# Patient Record
Sex: Male | Born: 1985 | Race: White | Hispanic: No | Marital: Married | Smoking: Never smoker
Health system: Southern US, Community
[De-identification: ages and names within clinical notes are randomized; demographics above are authoritative.]

## PROBLEM LIST (undated history)

## (undated) DIAGNOSIS — F419 Anxiety disorder, unspecified: Secondary | ICD-10-CM

## (undated) DIAGNOSIS — Z8619 Personal history of other infectious and parasitic diseases: Secondary | ICD-10-CM

## (undated) DIAGNOSIS — I10 Essential (primary) hypertension: Secondary | ICD-10-CM

## (undated) DIAGNOSIS — N289 Disorder of kidney and ureter, unspecified: Secondary | ICD-10-CM

## (undated) DIAGNOSIS — Z87442 Personal history of urinary calculi: Secondary | ICD-10-CM

## (undated) DIAGNOSIS — N189 Chronic kidney disease, unspecified: Secondary | ICD-10-CM

## (undated) HISTORY — DX: Personal history of other infectious and parasitic diseases: Z86.19

## (undated) HISTORY — DX: Anxiety disorder, unspecified: F41.9

## (undated) HISTORY — DX: Chronic kidney disease, unspecified: N18.9

## (undated) HISTORY — DX: Personal history of urinary calculi: Z87.442

---

## 2004-07-07 DIAGNOSIS — Z87442 Personal history of urinary calculi: Secondary | ICD-10-CM

## 2004-07-07 HISTORY — DX: Personal history of urinary calculi: Z87.442

## 2005-02-14 ENCOUNTER — Emergency Department (HOSPITAL_COMMUNITY): Admission: EM | Admit: 2005-02-14 | Discharge: 2005-02-15 | Payer: Self-pay | Admitting: Emergency Medicine

## 2005-09-06 IMAGING — CT CT PELVIS W/O CM
1 of 2 series · 15 of 32 positions shown, 19 images · non-contrast
Comparison: None.

CLINICAL DATA: Rule out stones.  Left flank pain.  
 ABDOMEN CT WITHOUT CONTRAST (RENAL STONE PROTOCOL):
TECHNIQUE: Multidetector CT imaging of the abdomen was performed following the standard protocol without IV contrast.
TECHNIQUE: Multidetector CT imaging of the pelvis was performed following the standard protocol without IV contrast.

[Series 2: renal stone · axial · 0.70mm/px · z∈[-441,-71]mm · 15 of 82 slices shown, 19 images]
[im 4/82  soft-tissue]
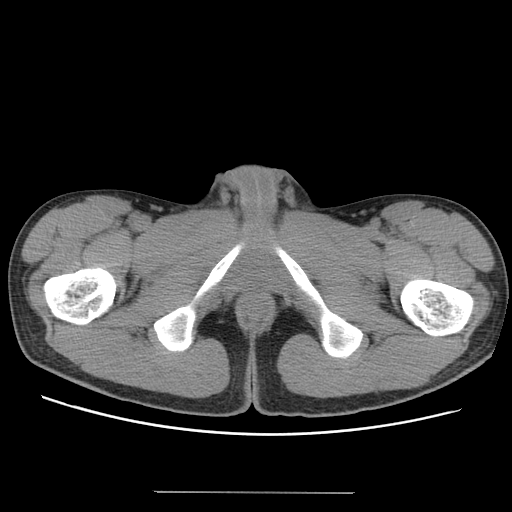
[im 4/82  bone]
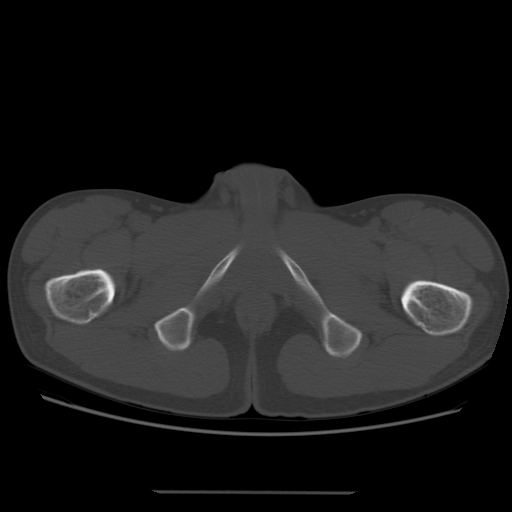
[im 11/82  soft-tissue]
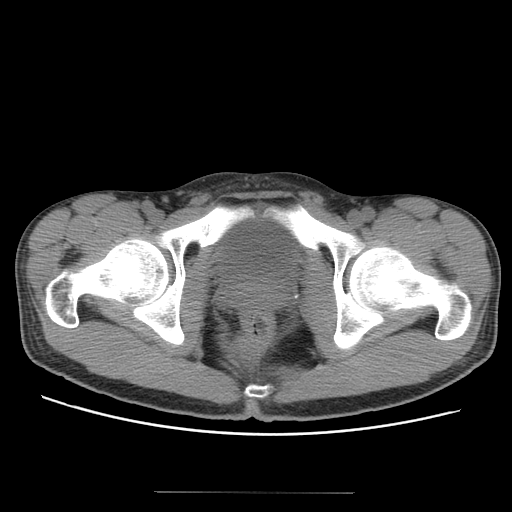
[im 17/82  soft-tissue]
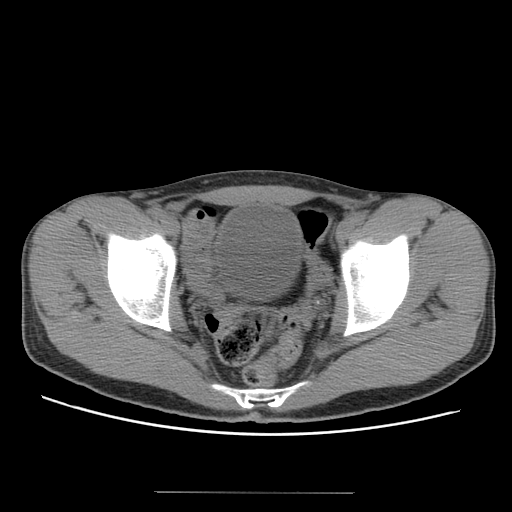
[im 24/82  soft-tissue]
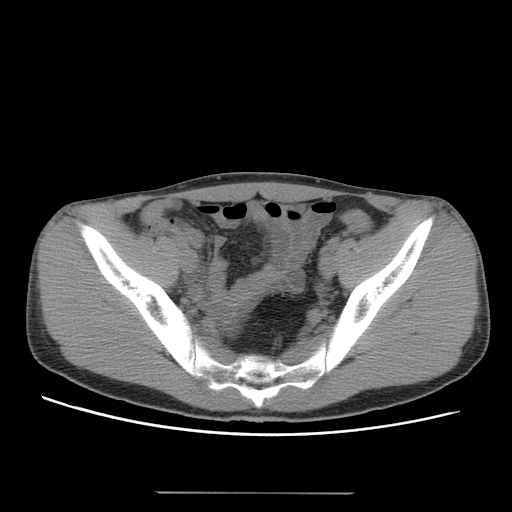
[im 28/82  soft-tissue]
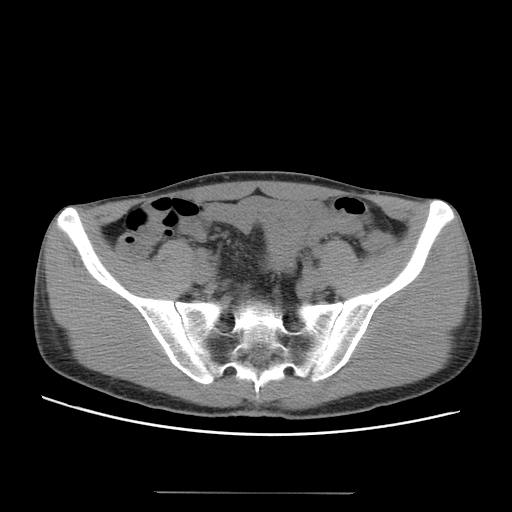
[im 34/82  soft-tissue]
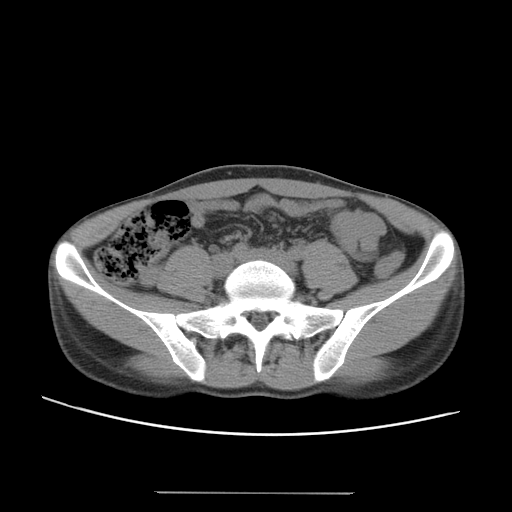
[im 41/82  soft-tissue]
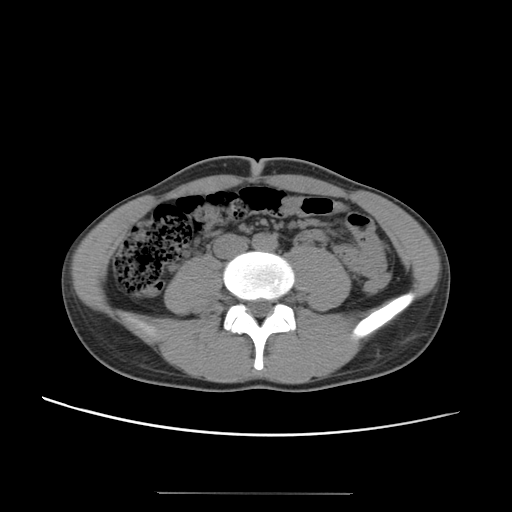
[im 48/82  soft-tissue]
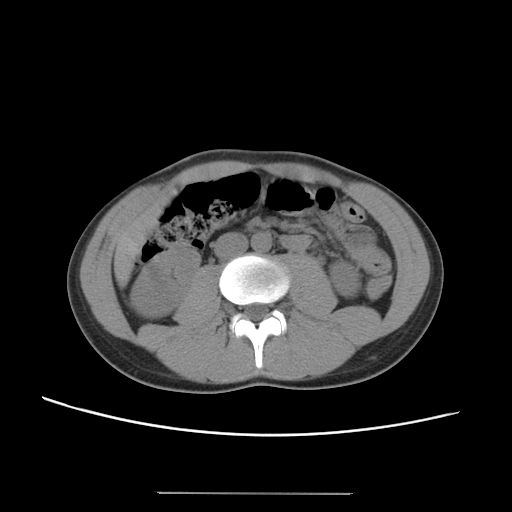
[im 55/82  soft-tissue]
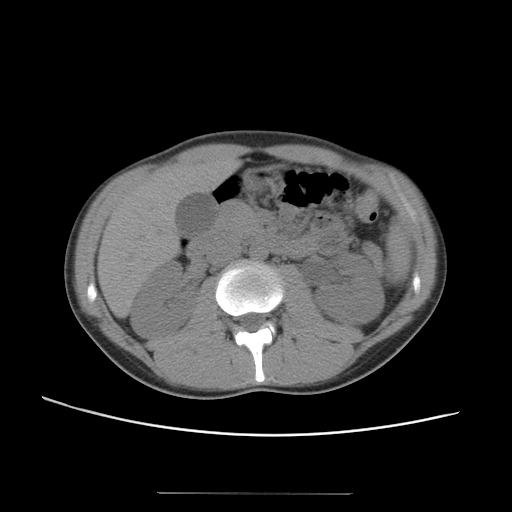
[im 55/82  bone]
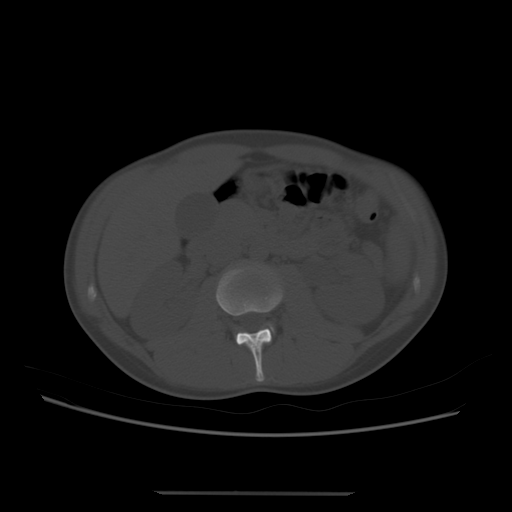
[im 58/82  soft-tissue]
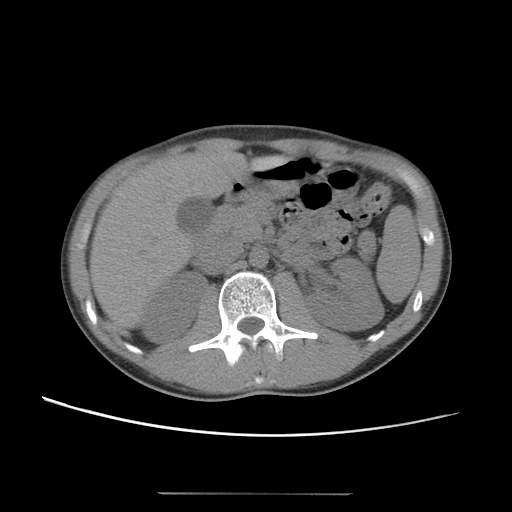
[im 65/82  soft-tissue]
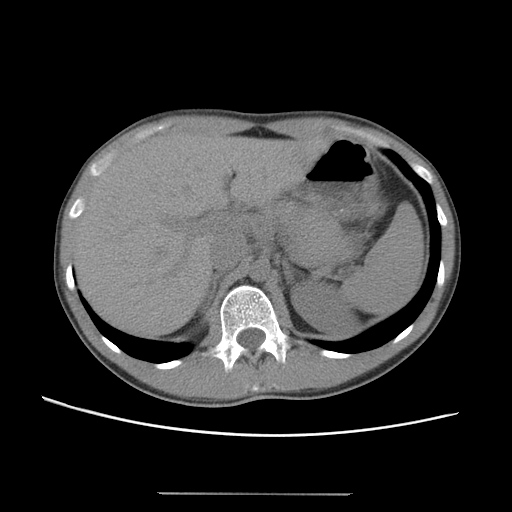
[im 68/82  lung]
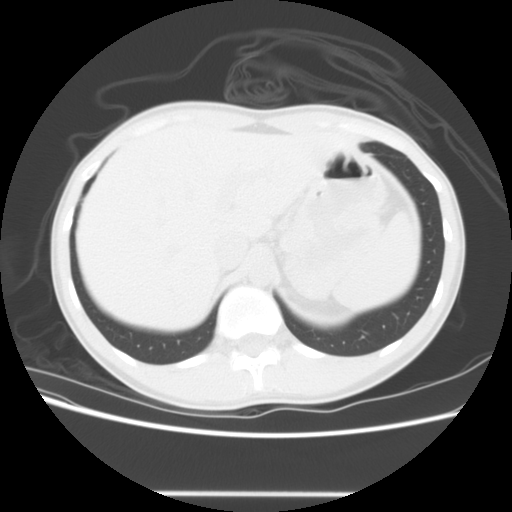
[im 71/82  soft-tissue]
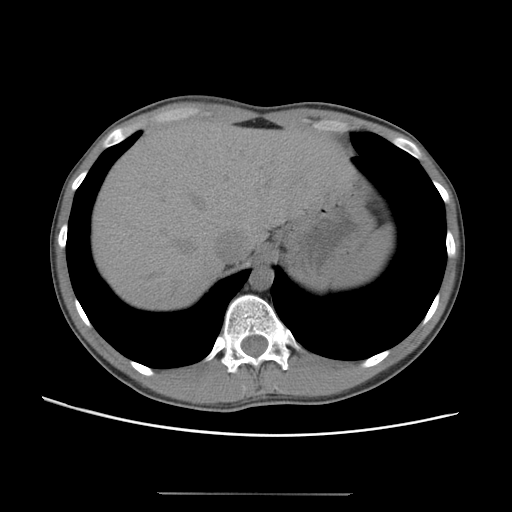
[im 71/82  lung]
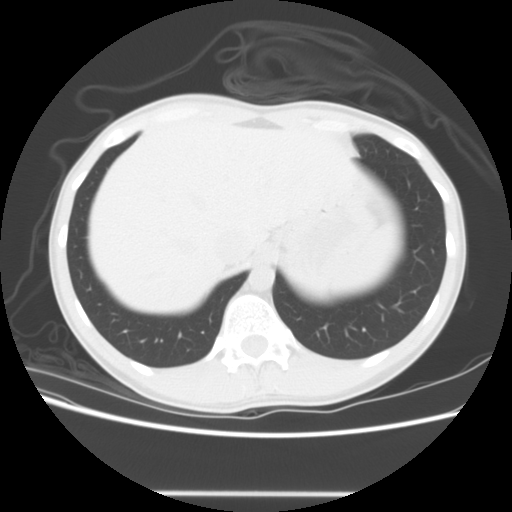
[im 75/82  lung]
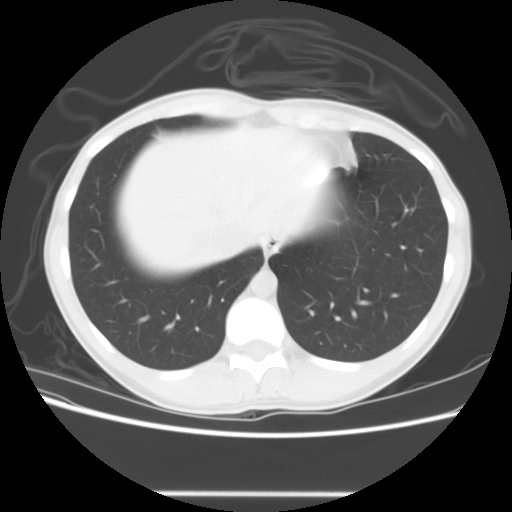
[im 78/82  soft-tissue]
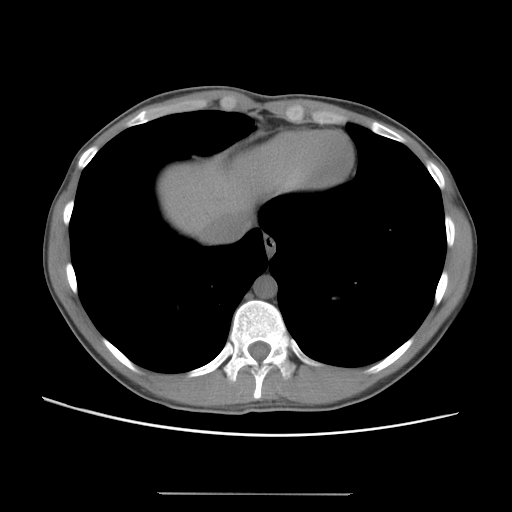
[im 78/82  lung]
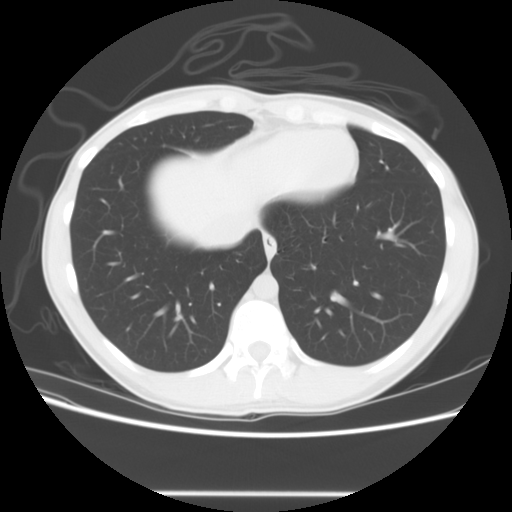

[15 of 32 positions shown; findings below may reference images not displayed]

FINDINGS: Visualized lung bases are clear. 
 The liver is normal in attenuation and morphology.  
 The spleen is negative. 
 Adrenal glands are negative. 
 Pancreas is negative.  
 Punctate calcification within the right renal collecting system is noted.  The right kidney is otherwise unremarkable without evidence for hydronephrosis.  On the left there is mild right-sided hydronephrosis.  A 2.5 mm nonobstructive stone is identified within the left distal ureter just proximal to the UPJ.
 Negative for lymphadenopathy.  
 Visualized bowel loops are unremarkable.  Appendix is negative.  No free abdominal fluid.
IMPRESSION: Mild left-sided hydronephrosis with a 2.5 mm distal left ureteral stone.  
 PELVIS CT WITHOUT CONTRAST (RENAL STONE PROTOCOL):
FINDINGS: No free pelvic fluid.  Pelvic bowel loops negative.  No pelvic lymphadenopathy.  Within the distal left ureter there is a tiny 2.5 mm distal left ureteral nonobstructive ureteral stone.
IMPRESSION: Nonobstructive distal left ureteral stone.

## 2011-12-22 ENCOUNTER — Encounter: Payer: Self-pay | Admitting: Internal Medicine

## 2011-12-22 ENCOUNTER — Ambulatory Visit (INDEPENDENT_AMBULATORY_CARE_PROVIDER_SITE_OTHER): Payer: BC Managed Care – PPO | Admitting: Internal Medicine

## 2011-12-22 VITALS — BP 112/82 | HR 70 | Temp 98.5°F | Resp 16 | Ht 68.0 in | Wt 152.0 lb

## 2011-12-22 DIAGNOSIS — Z0000 Encounter for general adult medical examination without abnormal findings: Secondary | ICD-10-CM

## 2011-12-22 HISTORY — PX: NO PAST SURGERIES: SHX2092

## 2011-12-22 LAB — LIPID PANEL
Total CHOL/HDL Ratio: 3.9 Ratio
Triglycerides: 91 mg/dL (ref <150)
VLDL: 18 mg/dL (ref 0–40)

## 2011-12-22 LAB — HEPATIC FUNCTION PANEL
ALT: 11 U/L (ref 0–53)
AST: 18 U/L (ref 0–37)
Albumin: 5 g/dL (ref 3.5–5.2)
Alkaline Phosphatase: 72 U/L (ref 39–117)
Bilirubin, Direct: 0.2 mg/dL (ref 0.0–0.3)
Indirect Bilirubin: 0.7 mg/dL (ref 0.0–0.9)
Total Bilirubin: 0.9 mg/dL (ref 0.3–1.2)
Total Protein: 7.6 g/dL (ref 6.0–8.3)

## 2011-12-22 LAB — CBC WITH DIFFERENTIAL/PLATELET
Basophils Absolute: 0 10*3/uL (ref 0.0–0.1)
Basophils Relative: 0 % (ref 0–1)
Eosinophils Absolute: 0.1 10*3/uL (ref 0.0–0.7)
Eosinophils Relative: 1 % (ref 0–5)
HCT: 44 % (ref 39.0–52.0)
Neutro Abs: 4.2 10*3/uL (ref 1.7–7.7)
RBC: 5.18 MIL/uL (ref 4.22–5.81)
WBC: 7.3 10*3/uL (ref 4.0–10.5)

## 2011-12-22 LAB — BASIC METABOLIC PANEL
BUN: 10 mg/dL (ref 6–23)
CO2: 26 mEq/L (ref 19–32)
Glucose, Bld: 83 mg/dL (ref 70–99)

## 2011-12-22 LAB — TSH: TSH: 1.259 u[IU]/mL (ref 0.350–4.500)

## 2011-12-22 NOTE — Progress Notes (Signed)
  Subjective:    Patient ID: Alejandro Weber, male    DOB: [birth year], 26 y.o.   MRN: [id number]  HPI Pt presents to clinic for physical exam. Is in good health without active complaint. Has h/o kidney stones with 2.5cm left ureteral stone noted on CT in 2006 however has had no further difficulty since that time.   Past Medical History  Diagnosis Date  . History of chicken pox     childhood  . History of kidney stones 2006   Past Surgical History  Procedure Date  . No past surgeries 12/22/2011    reports that he has never smoked. He has never used smokeless tobacco. He reports that he does not drink alcohol or use illicit drugs. family history includes Colon cancer in his paternal grandfather; Coronary artery disease in his maternal grandfather; Diabetes in his paternal grandmother; Heart disease in his father; Hyperlipidemia in his mother; Hypertension in his father; and Prostate cancer in his maternal grandfather. Allergies  Allergen Reactions  . Amoxicillin Hives     Review of Systems  Respiratory: Negative for cough.   Cardiovascular: Negative for chest pain.  Gastrointestinal: Negative for abdominal pain.       Objective:   Physical Exam  Physical Exam  Nursing note and vitals reviewed. Constitutional: He appears well-developed and well-nourished. No distress.  HENT:  Head: Normocephalic and atraumatic.  Right Ear: Tympanic membrane and external ear normal.  Left Ear: Tympanic membrane and external ear normal.  Nose: Nose normal.  Mouth/Throat: Uvula is midline, oropharynx is clear and moist and mucous membranes are normal. No oropharyngeal exudate.  Eyes: Conjunctivae and EOM are normal. Pupils are equal, round, and reactive to light. Right eye exhibits no discharge. Left eye exhibits no discharge. No scleral icterus.  Neck: Neck supple. Carotid bruit is not present. No thyromegaly present.  Cardiovascular: Normal rate, regular rhythm and normal heart sounds.  Exam  reveals no gallop and no friction rub.   No murmur heard. Pulmonary/Chest: Effort normal and breath sounds normal. No respiratory distress. He has no wheezes. He has no rales.  Abdominal: Soft. He exhibits no distension and no mass. There is no hepatosplenomegaly. There is no tenderness. There is no rebound. Hernia confirmed negative in the right inguinal area and confirmed negative in the left inguinal area.  Genitourinary:Right testis shows no mass, no swelling and no tenderness. Right testis is descended. Left testis shows no mass, no swelling and no tenderness. Left testis is descended.  Lymphadenopathy:    He has no cervical adenopathy.       Right: No inguinal adenopathy present.       Left: No inguinal adenopathy present.  Neurological: He is alert.  Skin: Skin is warm and dry. He is not diaphoretic.  Psychiatric: He has a normal mood and affect.        Assessment & Plan:

## 2011-12-22 NOTE — Assessment & Plan Note (Signed)
Nl exam. Obtain cpe labs. 

## 2011-12-22 NOTE — Patient Instructions (Signed)
Please schedule fasting labs prior to next year's physical Cbc, chem7, lipid, lft, tsh, ua with reflex- v70.0 

## 2011-12-23 LAB — URINALYSIS, ROUTINE W REFLEX MICROSCOPIC
Bilirubin Urine: NEGATIVE
Glucose, UA: NEGATIVE mg/dL
Leukocytes, UA: NEGATIVE
Protein, ur: NEGATIVE mg/dL
Specific Gravity, Urine: 1.017 (ref 1.005–1.030)

## 2015-04-23 ENCOUNTER — Encounter: Payer: Self-pay | Admitting: Physician Assistant

## 2015-04-23 ENCOUNTER — Ambulatory Visit (INDEPENDENT_AMBULATORY_CARE_PROVIDER_SITE_OTHER): Payer: Managed Care, Other (non HMO) | Admitting: Physician Assistant

## 2015-04-23 VITALS — BP 126/78 | HR 73 | Temp 98.1°F | Ht 68.5 in | Wt 157.4 lb

## 2015-04-23 DIAGNOSIS — F411 Generalized anxiety disorder: Secondary | ICD-10-CM

## 2015-04-23 LAB — BASIC METABOLIC PANEL
BUN: 14 mg/dL (ref 6–23)
CALCIUM: 9.8 mg/dL (ref 8.4–10.5)
CO2: 25 meq/L (ref 19–32)
Chloride: 106 mEq/L (ref 96–112)
Creatinine, Ser: 1.01 mg/dL (ref 0.40–1.50)
GFR: 91.85 mL/min (ref >60.00)
GLUCOSE: 91 mg/dL (ref 70–99)
Potassium: 4.5 mEq/L (ref 3.5–5.1)
SODIUM: 140 meq/L (ref 135–145)

## 2015-04-23 LAB — TSH: TSH: 1.39 u[IU]/mL (ref 0.35–4.50)

## 2015-04-23 MED ORDER — ESCITALOPRAM OXALATE 10 MG PO TABS: 10.0000 mg | ORAL_TABLET | Freq: Every day | ORAL | Status: DC

## 2015-04-23 NOTE — Assessment & Plan Note (Signed)
Will check TSH and BMP today. Will begin Lexapro 10 mg daily. Follow-up 4 weeks. Will do CPE at that time.

## 2015-04-23 NOTE — Patient Instructions (Signed)
Please go to the lab for blood work. I will call you with your results. Start the Lexapro as directed for anxiety. Remember it will take a few weeks to start working so make sure to take daily.  Follow-up 4-6 weeks for follow-up and a complete physical. Return sooner if needed.  Escitalopram tablets What is this medicine? ESCITALOPRAM (es sye TAL oh pram) is used to treat depression and certain types of anxiety. This medicine may be used for other purposes; ask your health care provider or pharmacist if you have questions. What should I tell my health care provider before I take this medicine? They need to know if you have any of these conditions: -bipolar disorder or a family history of bipolar disorder -diabetes -glaucoma -heart disease -kidney or liver disease -receiving electroconvulsive therapy -seizures (convulsions) -suicidal thoughts, plans, or attempt by you or a family member -an unusual or allergic reaction to escitalopram, the related drug citalopram, other medicines, foods, dyes, or preservatives -pregnant or trying to become pregnant -breast-feeding How should I use this medicine? Take this medicine by mouth with a glass of water. Follow the directions on the prescription label. You can take it with or without food. If it upsets your stomach, take it with food. Take your medicine at regular intervals. Do not take it more often than directed. Do not stop taking this medicine suddenly except upon the advice of your doctor. Stopping this medicine too quickly may cause serious side effects or your condition may worsen. A special MedGuide will be given to you by the pharmacist with each prescription and refill. Be sure to read this information carefully each time. Talk to your pediatrician regarding the use of this medicine in children. Special care may be needed. Overdosage: If you think you have taken too much of this medicine contact a poison control center or emergency room at  once. NOTE: This medicine is only for you. Do not share this medicine with others. What if I miss a dose? If you miss a dose, take it as soon as you can. If it is almost time for your next dose, take only that dose. Do not take double or extra doses. What may interact with this medicine? Do not take this medicine with any of the following medications: -certain medicines for fungal infections like fluconazole, itraconazole, ketoconazole, posaconazole, voriconazole -cisapride -citalopram -dofetilide -dronedarone -linezolid -MAOIs like Carbex, Eldepryl, Marplan, Nardil, and Parnate -methylene blue (injected into a vein) -pimozide -thioridazine -ziprasidone This medicine may also interact with the following medications: -alcohol -aspirin and aspirin-like medicines -carbamazepine -certain medicines for depression, anxiety, or psychotic disturbances -certain medicines for migraine headache like almotriptan, eletriptan, frovatriptan, naratriptan, rizatriptan, sumatriptan, zolmitriptan -certain medicines for sleep -certain medicines that treat or prevent blood clots like warfarin, enoxaparin, dalteparin -cimetidine -diuretics -fentanyl -furazolidone -isoniazid -lithium -metoprolol -NSAIDs, medicines for pain and inflammation, like ibuprofen or naproxen -other medicines that prolong the QT interval (cause an abnormal heart rhythm) -procarbazine -rasagiline -supplements like St. John's wort, kava kava, valerian -tramadol -tryptophan This list may not describe all possible interactions. Give your health care provider a list of all the medicines, herbs, non-prescription drugs, or dietary supplements you use. Also tell them if you smoke, drink alcohol, or use illegal drugs. Some items may interact with your medicine. What should I watch for while using this medicine? Tell your doctor if your symptoms do not get better or if they get worse. Visit your doctor or health care professional for  regular checks on  your progress. Because it may take several weeks to see the full effects of this medicine, it is important to continue your treatment as prescribed by your doctor. Patients and their families should watch out for new or worsening thoughts of suicide or depression. Also watch out for sudden changes in feelings such as feeling anxious, agitated, panicky, irritable, hostile, aggressive, impulsive, severely restless, overly excited and hyperactive, or not being able to sleep. If this happens, especially at the beginning of treatment or after a change in dose, call your health care professional. Bonita Quin may get drowsy or dizzy. Do not drive, use machinery, or do anything that needs mental alertness until you know how this medicine affects you. Do not stand or sit up quickly, especially if you are an older patient. This reduces the risk of dizzy or fainting spells. Alcohol may interfere with the effect of this medicine. Avoid alcoholic drinks. Your mouth may get dry. Chewing sugarless gum or sucking hard candy, and drinking plenty of water may help. Contact your doctor if the problem does not go away or is severe. What side effects may I notice from receiving this medicine? Side effects that you should report to your doctor or health care professional as soon as possible: -allergic reactions like skin rash, itching or hives, swelling of the face, lips, or tongue -confusion -feeling faint or lightheaded, falls -fast talking and excited feelings or actions that are out of control -hallucination, loss of contact with reality -seizures -suicidal thoughts or other mood changes -unusual bleeding or bruising Side effects that usually do not require medical attention (report to your doctor or health care professional if they continue or are bothersome): -blurred vision -changes in appetite -change in sex drive or performance -headache -increased sweating -nausea This list may not describe all  possible side effects. Call your doctor for medical advice about side effects. You may report side effects to FDA at 1-800-FDA-1088. Where should I keep my medicine? Keep out of reach of children. Store at room temperature between 15 and 30 degrees C (59 and 86 degrees F). Throw away any unused medicine after the expiration date. NOTE: This sheet is a summary. It may not cover all possible information. If you have questions about this medicine, talk to your doctor, pharmacist, or health care provider.    2016, [employer]. (2013-01-18 12:32:55)

## 2015-04-23 NOTE — Progress Notes (Signed)
Patient presents to clinic today to establish care.  Acute Concerns: Patient c/o anxiety stemming from stressors at work and at home. States he will also get random panic attacks with racing heart.  Denies chest pain with episodes. Wife has noticed a significant worsening of symptoms.    Health Maintenance: Immunizations -- Declines flu shot. Tetanus last 2005. Will get at physical.  Past Medical History  Diagnosis Date  . History of chicken pox     childhood  . History of kidney stones 2006  . Anxiety     Past Surgical History  Procedure Laterality Date  . No past surgeries  12/22/2011    No current outpatient prescriptions on file prior to visit.   No current facility-administered medications on file prior to visit.    Allergies  Allergen Reactions  . Amoxicillin Hives    Family History  Problem Relation Age of Onset  . Colon cancer Paternal Grandfather [id number]  . Prostate cancer Maternal Grandfather   . Hyperlipidemia Mother     Father,  MGM  . Heart disease Father 2040    cad-angioplasty  . Coronary artery disease Maternal Grandfather   . Hypertension Father     MGM  . Diabetes Paternal Grandmother     Social History   Social History  . Marital Status: Married    Spouse Name: N/A  . Number of Children: N/A  . Years of Education: N/A   Occupational History  . CPA    Social History Main Topics  . Smoking status: Never Smoker   . Smokeless tobacco: Never Used  . Alcohol Use: No  . Drug Use: No  . Sexual Activity:    Partners: Female   Other Topics Concern  . Not on file   Social History Narrative   Review of Systems  Constitutional: Negative for fever and weight loss.  HENT: Negative for ear discharge, ear pain, hearing loss and tinnitus.   Eyes: Negative for blurred vision, double vision, photophobia and pain.  Respiratory: Negative for cough and shortness of breath.   Cardiovascular: Negative for chest pain and palpitations.    Gastrointestinal: Negative for heartburn, nausea, vomiting, abdominal pain, diarrhea, constipation, blood in stool and melena.  Genitourinary: Negative for dysuria, urgency, frequency, hematuria and flank pain.  Musculoskeletal: Negative for falls.  Neurological: Negative for dizziness, loss of consciousness and headaches.  Endo/Heme/Allergies: Negative for environmental allergies.  Psychiatric/Behavioral: Negative for depression, suicidal ideas, hallucinations and substance abuse. The patient is nervous/anxious. The patient does not have insomnia.    BP 126/78 mmHg  Pulse 73  Temp(Src) 98.1 F (36.7 C) (Oral)  Ht 5' 8.5" (1.74 m)  Wt 157 lb 6.4 oz (71.396 kg)  BMI 23.58 kg/m2  SpO2 98%  Physical Exam  Constitutional: He is oriented to person, place, and time and well-developed, well-nourished, and in no distress.  HENT:  Head: Normocephalic and atraumatic.  Eyes: Conjunctivae are normal.  Neck: Neck supple. No thyromegaly present.  Cardiovascular: Normal rate, regular rhythm, normal heart sounds and intact distal pulses.   Pulmonary/Chest: Effort normal and breath sounds normal. No respiratory distress. He has no wheezes. He has no rales. He exhibits no tenderness.  Neurological: He is alert and oriented to person, place, and time.  Skin: Skin is warm and dry. No rash noted.  Psychiatric: Affect normal.  Vitals reviewed.  Assessment/Plan: Anxiety state Will check TSH and BMP today. Will begin Lexapro 10 mg daily. Follow-up 4 weeks. Will do CPE at that time.

## 2015-04-23 NOTE — Progress Notes (Signed)
Pre visit review using our clinic review tool, if applicable. No additional management support is needed unless otherwise documented below in the visit note. 

## 2015-05-28 ENCOUNTER — Encounter: Payer: Self-pay | Admitting: Physician Assistant

## 2015-05-28 ENCOUNTER — Ambulatory Visit (INDEPENDENT_AMBULATORY_CARE_PROVIDER_SITE_OTHER): Payer: Managed Care, Other (non HMO) | Admitting: Physician Assistant

## 2015-05-28 VITALS — BP 131/77 | HR 78 | Temp 98.2°F | Resp 16 | Ht 69.0 in | Wt 160.6 lb

## 2015-05-28 DIAGNOSIS — Z0000 Encounter for general adult medical examination without abnormal findings: Secondary | ICD-10-CM | POA: Diagnosis not present

## 2015-05-28 DIAGNOSIS — F411 Generalized anxiety disorder: Secondary | ICD-10-CM | POA: Diagnosis not present

## 2015-05-28 DIAGNOSIS — Z23 Encounter for immunization: Secondary | ICD-10-CM | POA: Diagnosis not present

## 2015-05-28 NOTE — Assessment & Plan Note (Signed)
Will attempt trial increase to 20 mg Lexapro. Patient to follow-up via phone in 2 weeks.

## 2015-05-28 NOTE — Patient Instructions (Signed)
Please schedule an appointment for fasting labs.  I will call you with results once I have them.  Please try to increase your Lexapro to 2 tablets (20 mg) daily over the next week. If you are tolerating let me know and I will call in a prescription for the new dose. If not tolerating, go back to 1 tablet daily and give me a call.  Preventive Care for Adults, Male A healthy lifestyle and preventive care can promote health and wellness. Preventive health guidelines for men include the following key practices:  A routine yearly physical is a good way to check with your health care provider about your health and preventative screening. It is a chance to share any concerns and updates on your health and to receive a thorough exam.  Visit your dentist for a routine exam and preventative care every 6 months. Brush your teeth twice a day and floss once a day. Good oral hygiene prevents tooth decay and gum disease.  The frequency of eye exams is based on your age, health, family medical history, use of contact lenses, and other factors. Follow your health care provider's recommendations for frequency of eye exams.  Eat a healthy diet. Foods such as vegetables, fruits, whole grains, low-fat dairy products, and lean protein foods contain the nutrients you need without too many calories. Decrease your intake of foods high in solid fats, added sugars, and salt. Eat the right amount of calories for you.Get information about a proper diet from your health care provider, if necessary.  Regular physical exercise is one of the most important things you can do for your health. Most adults should get at least 150 minutes of moderate-intensity exercise (any activity that increases your heart rate and causes you to sweat) each week. In addition, most adults need muscle-strengthening exercises on 2 or more days a week.  Maintain a healthy weight. The body mass index (BMI) is a screening tool to identify possible weight  problems. It provides an estimate of body fat based on height and weight. Your health care provider can find your BMI and can help you achieve or maintain a healthy weight.For adults 20 years and older:  A BMI below 18.5 is considered underweight.  A BMI of 18.5 to 24.9 is normal.  A BMI of 25 to 29.9 is considered overweight.  A BMI of 30 and above is considered obese.  Maintain normal blood lipids and cholesterol levels by exercising and minimizing your intake of saturated fat. Eat a balanced diet with plenty of fruit and vegetables. Blood tests for lipids and cholesterol should begin at age 67 and be repeated every 5 years. If your lipid or cholesterol levels are high, you are over 50, or you are at high risk for heart disease, you may need your cholesterol levels checked more frequently.Ongoing high lipid and cholesterol levels should be treated with medicines if diet and exercise are not working.  If you smoke, find out from your health care provider how to quit. If you do not use tobacco, do not start.  Lung cancer screening is recommended for adults aged 25-80 years who are at high risk for developing lung cancer because of a history of smoking. A yearly low-dose CT scan of the lungs is recommended for people who have at least a 30-pack-year history of smoking and are a current smoker or have quit within the past 15 years. A pack year of smoking is smoking an average of 1 pack of cigarettes a  day for 1 year (for example: 1 pack a day for 30 years or 2 packs a day for 15 years). Yearly screening should continue until the smoker has stopped smoking for at least 15 years. Yearly screening should be stopped for people who develop a health problem that would prevent them from having lung cancer treatment.  If you choose to drink alcohol, do not have more than 2 drinks per day. One drink is considered to be 12 ounces (355 mL) of beer, 5 ounces (148 mL) of wine, or 1.5 ounces (44 mL) of  liquor.  Avoid use of street drugs. Do not share needles with anyone. Ask for help if you need support or instructions about stopping the use of drugs.  High blood pressure causes heart disease and increases the risk of stroke. Your blood pressure should be checked at least every 1-2 years. Ongoing high blood pressure should be treated with medicines, if weight loss and exercise are not effective.  If you are 28-33 years old, ask your health care provider if you should take aspirin to prevent heart disease.  Diabetes screening is done by taking a blood sample to check your blood glucose level after you have not eaten for a certain period of time (fasting). If you are not overweight and you do not have risk factors for diabetes, you should be screened once every 3 years starting at age 34. If you are overweight or obese and you are 16-61 years of age, you should be screened for diabetes every year as part of your cardiovascular risk assessment.  Colorectal cancer can be detected and often prevented. Most routine colorectal cancer screening begins at the age of 31 and continues through age 58. However, your health care provider may recommend screening at an earlier age if you have risk factors for colon cancer. On a yearly basis, your health care provider may provide home test kits to check for hidden blood in the stool. Use of a small camera at the end of a tube to directly examine the colon (sigmoidoscopy or colonoscopy) can detect the earliest forms of colorectal cancer. Talk to your health care provider about this at age 62, when routine screening begins. Direct exam of the colon should be repeated every 5-10 years through age 38, unless early forms of precancerous polyps or small growths are found.  People who are at an increased risk for hepatitis B should be screened for this virus. You are considered at high risk for hepatitis B if:  You were born in a country where hepatitis B occurs often. Talk  with your health care provider about which countries are considered high risk.  Your parents were born in a high-risk country and you have not received a shot to protect against hepatitis B (hepatitis B vaccine).  You have HIV or AIDS.  You use needles to inject street drugs.  You live with, or have sex with, someone who has hepatitis B.  You are a man who has sex with other men (MSM).  You get hemodialysis treatment.  You take certain medicines for conditions such as cancer, organ transplantation, and autoimmune conditions.  Hepatitis C blood testing is recommended for all people born from [birth year] through 1965 and any individual with known risks for hepatitis C.  Practice safe sex. Use condoms and avoid high-risk sexual practices to reduce the spread of sexually transmitted infections (STIs). STIs include gonorrhea, chlamydia, syphilis, trichomonas, herpes, HPV, and human immunodeficiency virus (HIV). Herpes, HIV, and HPV  are viral illnesses that have no cure. They can result in disability, cancer, and death.  If you are a man who has sex with other men, you should be screened at least once per year for:  HIV.  Urethral, rectal, and pharyngeal infection of gonorrhea, chlamydia, or both.  If you are at risk of being infected with HIV, it is recommended that you take a prescription medicine daily to prevent HIV infection. This is called preexposure prophylaxis (PrEP). You are considered at risk if:  You are a man who has sex with other men (MSM) and have other risk factors.  You are a heterosexual man, are sexually active, and are at increased risk for HIV infection.  You take drugs by injection.  You are sexually active with a partner who has HIV.  Talk with your health care provider about whether you are at high risk of being infected with HIV. If you choose to begin PrEP, you should first be tested for HIV. You should then be tested every 3 months for as long as you are taking  PrEP.  A one-time screening for abdominal aortic aneurysm (AAA) and surgical repair of large AAAs by ultrasound are recommended for men ages 45 to 46 years who are current or former smokers.  Healthy men should no longer receive prostate-specific antigen (PSA) blood tests as part of routine cancer screening. Talk with your health care provider about prostate cancer screening.  Testicular cancer screening is not recommended for adult males who have no symptoms. Screening includes self-exam, a health care provider exam, and other screening tests. Consult with your health care provider about any symptoms you have or any concerns you have about testicular cancer.  Use sunscreen. Apply sunscreen liberally and repeatedly throughout the day. You should seek shade when your shadow is shorter than you. Protect yourself by wearing long sleeves, pants, a wide-brimmed hat, and sunglasses year round, whenever you are outdoors.  Once a month, do a whole-body skin exam, using a mirror to look at the skin on your back. Tell your health care provider about new moles, moles that have irregular borders, moles that are larger than a pencil eraser, or moles that have changed in shape or color.  Stay current with required vaccines (immunizations).  Influenza vaccine. All adults should be immunized every year.  Tetanus, diphtheria, and acellular pertussis (Td, Tdap) vaccine. An adult who has not previously received Tdap or who does not know his vaccine status should receive 1 dose of Tdap. This initial dose should be followed by tetanus and diphtheria toxoids (Td) booster doses every 10 years. Adults with an unknown or incomplete history of completing a 3-dose immunization series with Td-containing vaccines should begin or complete a primary immunization series including a Tdap dose. Adults should receive a Td booster every 10 years.  Varicella vaccine. An adult without evidence of immunity to varicella should receive 2  doses or a second dose if he has previously received 1 dose.  Human papillomavirus (HPV) vaccine. Males aged 11-21 years who have not received the vaccine previously should receive the 3-dose series. Males aged 22-26 years may be immunized. Immunization is recommended through the age of 12 years for any male who has sex with males and did not get any or all doses earlier. Immunization is recommended for any person with an immunocompromised condition through the age of 47 years if he did not get any or all doses earlier. During the 3-dose series, the second dose should be  obtained 4-8 weeks after the first dose. The third dose should be obtained 24 weeks after the first dose and 16 weeks after the second dose.  Zoster vaccine. One dose is recommended for adults aged 68 years or older unless certain conditions are present.  Measles, mumps, and rubella (MMR) vaccine. Adults born before [birth year] generally are considered immune to measles and mumps. Adults born in [birth year] or later should have 1 or more doses of MMR vaccine unless there is a contraindication to the vaccine or there is laboratory evidence of immunity to each of the three diseases. A routine second dose of MMR vaccine should be obtained at least 28 days after the first dose for students attending postsecondary schools, health care workers, or international travelers. People who received inactivated measles vaccine or an unknown type of measles vaccine during 1963-1967 should receive 2 doses of MMR vaccine. People who received inactivated mumps vaccine or an unknown type of mumps vaccine before 1979 and are at high risk for mumps infection should consider immunization with 2 doses of MMR vaccine. Unvaccinated health care workers born before [birth year] who lack laboratory evidence of measles, mumps, or rubella immunity or laboratory confirmation of disease should consider measles and mumps immunization with 2 doses of MMR vaccine or rubella immunization with 1 dose  of MMR vaccine.  Pneumococcal 13-valent conjugate (PCV13) vaccine. When indicated, a person who is uncertain of his immunization history and has no record of immunization should receive the PCV13 vaccine. All adults 72 years of age and older should receive this vaccine. An adult aged 53 years or older who has certain medical conditions and has not been previously immunized should receive 1 dose of PCV13 vaccine. This PCV13 should be followed with a dose of pneumococcal polysaccharide (PPSV23) vaccine. Adults who are at high risk for pneumococcal disease should obtain the PPSV23 vaccine at least 8 weeks after the dose of PCV13 vaccine. Adults older than 30 years of age who have normal immune system function should obtain the PPSV23 vaccine dose at least 1 year after the dose of PCV13 vaccine.  Pneumococcal polysaccharide (PPSV23) vaccine. When PCV13 is also indicated, PCV13 should be obtained first. All adults aged 94 years and older should be immunized. An adult younger than age 67 years who has certain medical conditions should be immunized. Any person who resides in a nursing home or long-term care facility should be immunized. An adult smoker should be immunized. People with an immunocompromised condition and certain other conditions should receive both PCV13 and PPSV23 vaccines. People with human immunodeficiency virus (HIV) infection should be immunized as soon as possible after diagnosis. Immunization during chemotherapy or radiation therapy should be avoided. Routine use of PPSV23 vaccine is not recommended for American Indians, [place] Natives, or people younger than 65 years unless there are medical conditions that require PPSV23 vaccine. When indicated, people who have unknown immunization and have no record of immunization should receive PPSV23 vaccine. One-time revaccination 5 years after the first dose of PPSV23 is recommended for people aged 19-64 years who have chronic kidney failure, nephrotic  syndrome, asplenia, or immunocompromised conditions. People who received 1-2 doses of PPSV23 before age 71 years should receive another dose of PPSV23 vaccine at age 46 years or later if at least 5 years have passed since the previous dose. Doses of PPSV23 are not needed for people immunized with PPSV23 at or after age 77 years.  Meningococcal vaccine. Adults with asplenia or persistent complement component deficiencies should receive 2  doses of quadrivalent meningococcal conjugate (MenACWY-D) vaccine. The doses should be obtained at least 2 months apart. Microbiologists working with certain meningococcal bacteria, [place] recruits, people at risk during an outbreak, and people who travel to or live in countries with a high rate of meningitis should be immunized. A first-year college student up through age 42 years who is living in a residence hall should receive a dose if he did not receive a dose on or after his 16th birthday. Adults who have certain high-risk conditions should receive one or more doses of vaccine.  Hepatitis A vaccine. Adults who wish to be protected from this disease, have chronic liver disease, work with hepatitis A-infected animals, work in hepatitis A research labs, or travel to or work in countries with a high rate of hepatitis A should be immunized. Adults who were previously unvaccinated and who anticipate close contact with an international adoptee during the first 60 days after arrival in the [place] from a country with a high rate of hepatitis A should be immunized.  Hepatitis B vaccine. Adults should be immunized if they wish to be protected from this disease, are under age 30 years and have diabetes, have chronic liver disease, have had more than one sex partner in the past 6 months, may be exposed to blood or other infectious body fluids, are household contacts or sex partners of hepatitis B positive people, are clients or workers in certain care facilities, or travel to  or work in countries with a high rate of hepatitis B.  Haemophilus influenzae type b (Hib) vaccine. A previously unvaccinated person with asplenia or sickle cell disease or having a scheduled splenectomy should receive 1 dose of Hib vaccine. Regardless of previous immunization, a recipient of a hematopoietic stem cell transplant should receive a 3-dose series 6-12 months after his successful transplant. Hib vaccine is not recommended for adults with HIV infection. Preventive Service / Frequency Ages 88 to 67  Blood pressure check.** / Every 3-5 years.  Lipid and cholesterol check.** / Every 5 years beginning at age 57.  Hepatitis C blood test.** / For any individual with known risks for hepatitis C.  Skin self-exam. / Monthly.  Influenza vaccine. / Every year.  Tetanus, diphtheria, and acellular pertussis (Tdap, Td) vaccine.** / Consult your health care provider. 1 dose of Td every 10 years.  Varicella vaccine.** / Consult your health care provider.  HPV vaccine. / 3 doses over 6 months, if 47 or younger.  Measles, mumps, rubella (MMR) vaccine.** / You need at least 1 dose of MMR if you were born in 1957 or later. You may also need a second dose.  Pneumococcal 13-valent conjugate (PCV13) vaccine.** / Consult your health care provider.  Pneumococcal polysaccharide (PPSV23) vaccine.** / 1 to 2 doses if you smoke cigarettes or if you have certain conditions.  Meningococcal vaccine.** / 1 dose if you are age 13 to 11 years and a [employer] researcher living in a residence hall, or have one of several medical conditions. You may also need additional booster doses.  Hepatitis A vaccine.** / Consult your health care provider.  Hepatitis B vaccine.** / Consult your health care provider.  Haemophilus influenzae type b (Hib) vaccine.** / Consult your health care provider. Ages 82 to 70  Blood pressure check.** / Every year.  Lipid and cholesterol check.** / Every 5 years beginning  at age 2.  Lung cancer screening. / Every year if you are aged 6-80 years and have a 30-pack-year  history of smoking and currently smoke or have quit within the past 15 years. Yearly screening is stopped once you have quit smoking for at least 15 years or develop a health problem that would prevent you from having lung cancer treatment.  Fecal occult blood test (FOBT) of stool. / Every year beginning at age 7 and continuing until age 21. You may not have to do this test if you get a colonoscopy every 10 years.  Flexible sigmoidoscopy** or colonoscopy.** / Every 5 years for a flexible sigmoidoscopy or every 10 years for a colonoscopy beginning at age 36 and continuing until age 13.  Hepatitis C blood test.** / For all people born from [birth year] through 1965 and any individual with known risks for hepatitis C.  Skin self-exam. / Monthly.  Influenza vaccine. / Every year.  Tetanus, diphtheria, and acellular pertussis (Tdap/Td) vaccine.** / Consult your health care provider. 1 dose of Td every 10 years.  Varicella vaccine.** / Consult your health care provider.  Zoster vaccine.** / 1 dose for adults aged 3 years or older.  Measles, mumps, rubella (MMR) vaccine.** / You need at least 1 dose of MMR if you were born in 1958 or later. You may also need a second dose.  Pneumococcal 13-valent conjugate (PCV13) vaccine.** / Consult your health care provider.  Pneumococcal polysaccharide (PPSV23) vaccine.** / 1 to 2 doses if you smoke cigarettes or if you have certain conditions.  Meningococcal vaccine.** / Consult your health care provider.  Hepatitis A vaccine.** / Consult your health care provider.  Hepatitis B vaccine.** / Consult your health care provider.  Haemophilus influenzae type b (Hib) vaccine.** / Consult your health care provider. Ages 61 and over  Blood pressure check.** / Every year.  Lipid and cholesterol check.**/ Every 5 years beginning at age 29.  Lung cancer screening.  / Every year if you are aged 51-80 years and have a 30-pack-year history of smoking and currently smoke or have quit within the past 15 years. Yearly screening is stopped once you have quit smoking for at least 15 years or develop a health problem that would prevent you from having lung cancer treatment.  Fecal occult blood test (FOBT) of stool. / Every year beginning at age 3 and continuing until age 39. You may not have to do this test if you get a colonoscopy every 10 years.  Flexible sigmoidoscopy** or colonoscopy.** / Every 5 years for a flexible sigmoidoscopy or every 10 years for a colonoscopy beginning at age 68 and continuing until age 61.  Hepatitis C blood test.** / For all people born from [birth year] through 1965 and any individual with known risks for hepatitis C.  Abdominal aortic aneurysm (AAA) screening.** / A one-time screening for ages 1 to 53 years who are current or former smokers.  Skin self-exam. / Monthly.  Influenza vaccine. / Every year.  Tetanus, diphtheria, and acellular pertussis (Tdap/Td) vaccine.** / 1 dose of Td every 10 years.  Varicella vaccine.** / Consult your health care provider.  Zoster vaccine.** / 1 dose for adults aged 80 years or older.  Pneumococcal 13-valent conjugate (PCV13) vaccine.** / 1 dose for all adults aged 73 years and older.  Pneumococcal polysaccharide (PPSV23) vaccine.** / 1 dose for all adults aged 39 years and older.  Meningococcal vaccine.** / Consult your health care provider.  Hepatitis A vaccine.** / Consult your health care provider.  Hepatitis B vaccine.** / Consult your health care provider.  Haemophilus influenzae type b (Hib) vaccine.** /  Consult your health care provider. **Family history and personal history of risk and conditions may change your health care provider's recommendations.   This information is not intended to replace advice given to you by your health care provider. Make sure you discuss any questions you  have with your health care provider.   Document Released: 08/19/2001 Document Revised: 07/14/2014 Document Reviewed: 11/18/2010 Elsevier Interactive Patient Education [employer].

## 2015-05-28 NOTE — Progress Notes (Signed)
Pre visit review using our clinic review tool, if applicable. No additional management support is needed unless otherwise documented below in the visit note/SLS  

## 2015-05-28 NOTE — Progress Notes (Signed)
Patient presents to clinic today for annual exam.  Patient is fasting for labs.  Acute Concerns: No acute concerns today  Chronic Issues: Anxiety State -- Is taking Lexapro 10 mg daily. Endorses taking daily as directed with some relief in symptoms. Is doing better in social situations. Other stressors are still causing anxiety. Denies panic attack presently. Denies depressed mood, SI/HI.  Health Maintenance: Dental -- up-to-date Vision -- up-to-date Immunizations -- Declines flu shot. Is due for Tetanus shot.  Past Medical History  Diagnosis Date  . History of chicken pox     childhood  . History of kidney stones 2006  . Anxiety     Past Surgical History  Procedure Laterality Date  . No past surgeries  12/22/2011    Current Outpatient Prescriptions on File Prior to Visit  Medication Sig Dispense Refill  . escitalopram (LEXAPRO) 10 MG tablet Take 1 tablet (10 mg total) by mouth daily. 30 tablet 5   No current facility-administered medications on file prior to visit.    Allergies  Allergen Reactions  . Amoxicillin Hives    Family History  Problem Relation Age of Onset  . Colon cancer Paternal Grandfather 5  . Prostate cancer Maternal Grandfather   . Hyperlipidemia Mother     Father,  MGM  . Heart disease Father 36    cad-angioplasty  . Coronary artery disease Maternal Grandfather   . Hypertension Father     MGM  . Diabetes Paternal Grandmother     Social History   Social History  . Marital Status: Married    Spouse Name: N/A  . Number of Children: N/A  . Years of Education: N/A   Occupational History  . CPA    Social History Main Topics  . Smoking status: Never Smoker   . Smokeless tobacco: Never Used  . Alcohol Use: No  . Drug Use: No  . Sexual Activity:    Partners: Female   Other Topics Concern  . Not on file   Social History Narrative   Review of Systems  Constitutional: Negative for fever and weight loss.  HENT: Negative for ear  discharge, ear pain, hearing loss and tinnitus.   Eyes: Negative for blurred vision, double vision, photophobia and pain.  Respiratory: Negative for cough and shortness of breath.   Cardiovascular: Negative for chest pain and palpitations.  Gastrointestinal: Negative for heartburn, nausea, vomiting, abdominal pain, diarrhea, constipation, blood in stool and melena.  Genitourinary: Negative for dysuria, urgency, frequency, hematuria and flank pain.  Musculoskeletal: Negative for falls.  Neurological: Negative for dizziness, loss of consciousness and headaches.  Endo/Heme/Allergies: Negative for environmental allergies.  Psychiatric/Behavioral: Negative for depression, suicidal ideas, hallucinations and substance abuse. The patient is nervous/anxious. The patient does not have insomnia.     BP 131/77 mmHg  Pulse 78  Temp(Src) 98.2 F (36.8 C) (Oral)  Resp 16  Ht  (1.753 m)  Wt 160 lb 9 oz (72.831 kg)  BMI 23.70 kg/m2  SpO2 100%  Physical Exam  Constitutional: He is oriented to person, place, and time and well-developed, well-nourished, and in no distress.  HENT:  Head: Normocephalic and atraumatic.  Right Ear: External ear normal.  Left Ear: External ear normal.  Nose: Nose normal.  Mouth/Throat: Oropharynx is clear and moist. No oropharyngeal exudate.  Eyes: Conjunctivae and EOM are normal. Pupils are equal, round, and reactive to light.  Neck: Neck supple. No thyromegaly present.  Cardiovascular: Normal rate, regular rhythm, normal heart sounds and  intact distal pulses.   Pulmonary/Chest: Effort normal and breath sounds normal. No respiratory distress. He has no wheezes. He has no rales. He exhibits no tenderness.  Abdominal: Soft. Bowel sounds are normal. He exhibits no distension and no mass. There is no tenderness. There is no rebound and no guarding.  Genitourinary: Testes/scrotum normal.  Lymphadenopathy:    He has no cervical adenopathy.  Neurological: He is alert and  oriented to person, place, and time.  Skin: Skin is warm and dry. No rash noted.  Psychiatric: Affect normal.  Vitals reviewed.   Recent Results (from the past 2160 hour(s))  TSH     Status: None   Collection Time: 04/23/15  8:46 AM  Result Value Ref Range   TSH 1.39 0.35 - 4.50 uIU/mL  Basic Metabolic Panel (BMET)     Status: None   Collection Time: 04/23/15  8:46 AM  Result Value Ref Range   Sodium 140 135 - 145 mEq/L   Potassium 4.5 3.5 - 5.1 mEq/L   Chloride 106 96 - 112 mEq/L   CO2 25 19 - 32 mEq/L   Glucose, Bld 91 70 - 99 mg/dL   BUN 14 6 - 23 mg/dL   Creatinine, Ser 2.691.01 0.40 - 1.50 mg/dL   Calcium 9.8 8.4 - 48.510.5 mg/dL   GFR 46.2791.85 >03.50>60.00 mL/min    Assessment/Plan: Anxiety state Will attempt trial increase to 20 mg Lexapro. Patient to follow-up via phone in 2 weeks.  Visit for preventive health examination Depression screen negative. Health Maintenance reviewed -- flu declined. TDaP given today. Preventive schedule discussed and handout given in AVS. Will obtain fasting labs today.

## 2015-05-28 NOTE — Assessment & Plan Note (Signed)
Depression screen negative. Health Maintenance reviewed -- flu declined. TDaP given today. Preventive schedule discussed and handout given in AVS. Will obtain fasting labs today.

## 2015-05-30 ENCOUNTER — Other Ambulatory Visit (INDEPENDENT_AMBULATORY_CARE_PROVIDER_SITE_OTHER): Payer: Managed Care, Other (non HMO)

## 2015-05-30 ENCOUNTER — Other Ambulatory Visit: Payer: Self-pay | Admitting: Physician Assistant

## 2015-05-30 DIAGNOSIS — Z0000 Encounter for general adult medical examination without abnormal findings: Secondary | ICD-10-CM

## 2015-05-30 DIAGNOSIS — Z114 Encounter for screening for human immunodeficiency virus [HIV]: Secondary | ICD-10-CM

## 2015-05-30 LAB — LIPID PANEL
CHOL/HDL RATIO: 3
Cholesterol: 189 mg/dL (ref 0–200)
HDL: 60.3 mg/dL (ref >39.00)
LDL CALC: 115 mg/dL — AB (ref 0–99)
NonHDL: 128.92
Triglycerides: 69 mg/dL (ref 0.0–149.0)
VLDL: 13.8 mg/dL (ref 0.0–40.0)

## 2015-05-30 LAB — COMPREHENSIVE METABOLIC PANEL
ALT: 18 U/L (ref 0–53)
AST: 20 U/L (ref 0–37)
Albumin: 4.6 g/dL (ref 3.5–5.2)
Alkaline Phosphatase: 57 U/L (ref 39–117)
BILIRUBIN TOTAL: 0.7 mg/dL (ref 0.2–1.2)
BUN: 17 mg/dL (ref 6–23)
CHLORIDE: 105 meq/L (ref 96–112)
CO2: 29 meq/L (ref 19–32)
CREATININE: 0.99 mg/dL (ref 0.40–1.50)
Calcium: 9.5 mg/dL (ref 8.4–10.5)
GFR: 93.94 mL/min (ref >60.00)
GLUCOSE: 89 mg/dL (ref 70–99)
Potassium: 3.6 mEq/L (ref 3.5–5.1)
Sodium: 142 mEq/L (ref 135–145)
Total Protein: 7.5 g/dL (ref 6.0–8.3)

## 2015-05-30 LAB — URINALYSIS, ROUTINE W REFLEX MICROSCOPIC
Bilirubin Urine: NEGATIVE
Hgb urine dipstick: NEGATIVE
KETONES UR: NEGATIVE
Leukocytes, UA: NEGATIVE
NITRITE: NEGATIVE
PH: 6 (ref 5.0–8.0)
RBC / HPF: NONE SEEN (ref 0–?)
SPECIFIC GRAVITY, URINE: 1.025 (ref 1.000–1.030)
Total Protein, Urine: NEGATIVE
URINE GLUCOSE: NEGATIVE
UROBILINOGEN UA: 0.2 (ref 0.0–1.0)
WBC UA: NONE SEEN (ref 0–?)

## 2015-05-30 LAB — CBC WITH DIFFERENTIAL/PLATELET
BASOS ABS: 0 10*3/uL (ref 0.0–0.1)
Basophils Relative: 0.2 % (ref 0.0–3.0)
EOS ABS: 0.1 10*3/uL (ref 0.0–0.7)
Eosinophils Relative: 1.6 % (ref 0.0–5.0)
HCT: 44.5 % (ref 39.0–52.0)
Hemoglobin: 15 g/dL (ref 13.0–17.0)
LYMPHS ABS: 2.4 10*3/uL (ref 0.7–4.0)
Lymphocytes Relative: 29.5 % (ref 12.0–46.0)
MCHC: 33.6 g/dL (ref 30.0–36.0)
MCV: 90.4 fl (ref 78.0–100.0)
MONO ABS: 0.8 10*3/uL (ref 0.1–1.0)
Monocytes Relative: 9.2 % (ref 3.0–12.0)
NEUTROS ABS: 4.9 10*3/uL (ref 1.4–7.7)
NEUTROS PCT: 59.5 % (ref 43.0–77.0)
PLATELETS: 303 10*3/uL (ref 150.0–400.0)
RBC: 4.93 Mil/uL (ref 4.22–5.81)
RDW: 13 % (ref 11.5–15.5)
WBC: 8.2 10*3/uL (ref 4.0–10.5)

## 2015-05-30 LAB — HEMOGLOBIN A1C: Hgb A1c MFr Bld: 5.1 % (ref 4.6–6.5)

## 2015-05-31 LAB — HIV ANTIBODY (ROUTINE TESTING W REFLEX): HIV 1&2 Ab, 4th Generation: NONREACTIVE

## 2015-06-06 ENCOUNTER — Other Ambulatory Visit: Payer: Self-pay | Admitting: Physician Assistant

## 2015-06-06 MED ORDER — ESCITALOPRAM OXALATE 20 MG PO TABS: 20.0000 mg | ORAL_TABLET | Freq: Every day | ORAL | Status: DC

## 2015-06-06 NOTE — Addendum Note (Signed)
Addended by: Regis BillSCATES, SHARON L on: 06/06/2015 01:48 PM   Modules accepted: Orders

## 2015-12-05 ENCOUNTER — Telehealth: Payer: Self-pay | Admitting: Physician Assistant

## 2015-12-05 NOTE — Telephone Encounter (Signed)
Notified pt - he will see you Friday.

## 2015-12-05 NOTE — Telephone Encounter (Signed)
He is not scheduled for a CPE (Had in November). He is scheduled for a medication follow-up. He needs appointment for further refills.

## 2015-12-05 NOTE — Telephone Encounter (Signed)
VM from pt 5/31 11:18am to find out if he needs to come for cpe 6/2 to get refills on lexapro or if they can be sent w/o appt. He has enough until Friday. Please notify pt [phone].

## 2015-12-07 ENCOUNTER — Ambulatory Visit (INDEPENDENT_AMBULATORY_CARE_PROVIDER_SITE_OTHER): Payer: Managed Care, Other (non HMO) | Admitting: Physician Assistant

## 2015-12-07 ENCOUNTER — Encounter: Payer: Self-pay | Admitting: Physician Assistant

## 2015-12-07 VITALS — BP 120/78 | HR 95 | Temp 98.3°F | Resp 16 | Ht 69.0 in | Wt 170.0 lb

## 2015-12-07 DIAGNOSIS — F411 Generalized anxiety disorder: Secondary | ICD-10-CM

## 2015-12-07 MED ORDER — ESCITALOPRAM OXALATE 20 MG PO TABS: 20.0000 mg | ORAL_TABLET | Freq: Every day | ORAL | Status: DC

## 2015-12-07 NOTE — Progress Notes (Signed)
Pre visit review using our clinic review tool, if applicable. No additional management support is needed unless otherwise documented below in the visit note/SLS  

## 2015-12-07 NOTE — Patient Instructions (Signed)
Please continue medications as directed. Increase exercise to an eventual goal of 150 minutes per week. Follow-up with me in 6 months. Return sooner if you need us.

## 2015-12-07 NOTE — Progress Notes (Signed)
    Patient presents to clinic today c/o for follow-up of anxiety, currently on Lexapro 20 mg daily. Is taking medication every evening. Endorses good relief of symptoms with medication. Denies breakthrough anxiety. Denies depressed mood or anhedonia. .   Past Medical History  Diagnosis Date  . History of chicken pox     childhood  . History of kidney stones 2006  . Anxiety     No current outpatient prescriptions on file prior to visit.   No current facility-administered medications on file prior to visit.    Allergies  Allergen Reactions  . Amoxicillin Hives    Family History  Problem Relation Age of Onset  . Colon cancer Paternal Grandfather 1370  . Prostate cancer Maternal Grandfather   . Hyperlipidemia Mother     Father,  MGM  . Heart disease Father [id number]    cad-angioplasty  . Coronary artery disease Maternal Grandfather   . Hypertension Father     MGM  . Diabetes Paternal Grandmother     Social History   Social History  . Marital Status: Married    Spouse Name: N/A  . Number of Children: N/A  . Years of Education: N/A   Occupational History  . CPA    Social History Main Topics  . Smoking status: Never Smoker   . Smokeless tobacco: Never Used  . Alcohol Use: No  . Drug Use: No  . Sexual Activity:    Partners: Female   Other Topics Concern  . None   Social History Narrative   Review of Systems - See HPI.  All other ROS are negative.  BP 120/78 mmHg  Pulse 95  Temp(Src) 98.3 F (36.8 C) (Oral)  Resp 16  Ht 5\' 9"  (1.753 m)  Wt 170 lb (77.111 kg)  BMI 25.09 kg/m2  SpO2 95%  Physical Exam  Constitutional: He is oriented to person, place, and time and well-developed, well-nourished, and in no distress.  HENT:  Head: Normocephalic and atraumatic.  Cardiovascular: Normal rate, regular rhythm, normal heart sounds and intact distal pulses.   Pulmonary/Chest: Effort normal and breath sounds normal. No respiratory distress. He has no wheezes. He has no  rales. He exhibits no tenderness.  Neurological: He is alert and oriented to person, place, and time.  Skin: Skin is warm and dry. No rash noted.  Psychiatric: Affect normal.  Vitals reviewed.   No results found for this or any previous visit (from the past 2160 hour(s)).  Assessment/Plan: Anxiety state Doing very well. No side effect of medication. Continue current regimen. Medications refilled. FU 6 months.   Piedad ClimesMartin, Stefania Goulart Cody, PA-C

## 2015-12-07 NOTE — Assessment & Plan Note (Signed)
Doing very well. No side effect of medication. Continue current regimen. Medications refilled. FU 6 months.

## 2016-06-06 ENCOUNTER — Ambulatory Visit (INDEPENDENT_AMBULATORY_CARE_PROVIDER_SITE_OTHER): Payer: Managed Care, Other (non HMO) | Admitting: Physician Assistant

## 2016-06-06 ENCOUNTER — Ambulatory Visit: Payer: Managed Care, Other (non HMO) | Admitting: Physician Assistant

## 2016-06-06 ENCOUNTER — Encounter: Payer: Self-pay | Admitting: Physician Assistant

## 2016-06-06 DIAGNOSIS — F411 Generalized anxiety disorder: Secondary | ICD-10-CM

## 2016-06-06 NOTE — Progress Notes (Signed)
Pre visit review using our clinic review tool, if applicable. No additional management support is needed unless otherwise documented below in the visit note. 

## 2016-06-06 NOTE — Patient Instructions (Signed)
I am glad you are managing well without medication. I do want you to work on an exercise regimen as this is good for physical, mental and emotional health. Find activities that you enjoy that help you decompress and manage stress and anxiety.  If you change your mind and want to restart medication or have me set you up with a counselor, please call or come see me.   Please schedule an appointment for a complete physical at your earliest convenience.

## 2016-06-06 NOTE — Progress Notes (Signed)
   Patient presents to clinic today for follow-up of generalized anxiety disorder. Patient previously prescribed Lexapro. Was taking as directed until a few months ago. Denies side effect of medication but wanted to handle anxiety without medication.  Weaned himself off of medication over the course of a few weeks. Endorses doing well overall off of the medication. Does still note good amount of anxiety, mainly from stress but wants to control on his own. Denies depressed mood or anhedonia. Denies change to sleep or appetite. Denies panic attack. Does not want to try counseling.   Denies acute concerns at today's visit.   Past Medical History:  Diagnosis Date  . Anxiety   . History of chicken pox    childhood  . History of kidney stones 2006    Current Outpatient Prescriptions on File Prior to Visit  Medication Sig Dispense Refill  . Multiple Vitamins-Minerals (MENS MULTIVITAMIN PLUS PO) Take by mouth daily.    Marland Kitchen. escitalopram (LEXAPRO) 20 MG tablet Take 1 tablet (20 mg total) by mouth daily. (Patient not taking: Reported on 06/06/2016) 90 tablet 1   No current facility-administered medications on file prior to visit.     Allergies  Allergen Reactions  . Amoxicillin Hives    Family History  Problem Relation Age of Onset  . Hyperlipidemia Mother     Father,  MGM  . Heart disease Father [id number]    cad-angioplasty  . Hypertension Father     MGM  . Colon cancer Paternal Grandfather [id number]  . Prostate cancer Maternal Grandfather   . Coronary artery disease Maternal Grandfather   . Diabetes Paternal Grandmother     Social History   Social History  . Marital status: Married    Spouse name: N/A  . Number of children: N/A  . Years of education: N/A   Occupational History  . CPA    Social History Main Topics  . Smoking status: Never Smoker  . Smokeless tobacco: Never Used  . Alcohol use No  . Drug use: No  . Sexual activity: Yes    Partners: Female   Other Topics Concern  .  None   Social History Narrative  . None   Review of Systems - See HPI.  All other ROS are negative.  BP 124/80   Pulse 66   Temp 98.3 F (36.8 C) (Oral)   Resp 14   Ht 5\' 9"  (1.753 m)   Wt 164 lb (74.4 kg)   SpO2 98%   BMI 24.22 kg/m   Physical Exam  Constitutional: He is oriented to person, place, and time and well-developed, well-nourished, and in no distress.  HENT:  Head: Normocephalic and atraumatic.  Eyes: Conjunctivae are normal.  Neck: Neck supple.  Cardiovascular: Normal rate, regular rhythm, normal heart sounds and intact distal pulses.   Pulmonary/Chest: Effort normal and breath sounds normal. No respiratory distress. He has no wheezes. He has no rales. He exhibits no tenderness.  Neurological: He is alert and oriented to person, place, and time.  Skin: Skin is warm and dry. No rash noted.  Psychiatric: Affect normal.  Vitals reviewed.  Assessment/Plan: Anxiety state Patient has weaned off of medication. Is doing well overall. Declines counseling or medication. Will monitor.     Piedad ClimesMartin, Latorya Bautch Cody, PA-C

## 2016-06-09 NOTE — Assessment & Plan Note (Signed)
Patient has weaned off of medication. Is doing well overall. Declines counseling or medication. Will monitor.

## 2016-06-20 ENCOUNTER — Ambulatory Visit (INDEPENDENT_AMBULATORY_CARE_PROVIDER_SITE_OTHER): Payer: Managed Care, Other (non HMO) | Admitting: Physician Assistant

## 2016-06-20 ENCOUNTER — Encounter: Payer: Self-pay | Admitting: Physician Assistant

## 2016-06-20 VITALS — BP 122/80 | HR 72 | Temp 98.2°F | Resp 14 | Ht 69.0 in | Wt 163.0 lb

## 2016-06-20 DIAGNOSIS — J208 Acute bronchitis due to other specified organisms: Secondary | ICD-10-CM | POA: Diagnosis not present

## 2016-06-20 DIAGNOSIS — Z0000 Encounter for general adult medical examination without abnormal findings: Secondary | ICD-10-CM

## 2016-06-20 DIAGNOSIS — B9689 Other specified bacterial agents as the cause of diseases classified elsewhere: Secondary | ICD-10-CM

## 2016-06-20 LAB — COMPREHENSIVE METABOLIC PANEL
ALT: 20 U/L (ref 0–53)
AST: 18 U/L (ref 0–37)
Albumin: 5 g/dL (ref 3.5–5.2)
Alkaline Phosphatase: 60 U/L (ref 39–117)
BUN: 11 mg/dL (ref 6–23)
CHLORIDE: 106 meq/L (ref 96–112)
CO2: 28 meq/L (ref 19–32)
CREATININE: 1.04 mg/dL (ref 0.40–1.50)
Calcium: 10 mg/dL (ref 8.4–10.5)
GFR: 88.13 mL/min (ref >60.00)
GLUCOSE: 92 mg/dL (ref 70–99)
Potassium: 4.4 mEq/L (ref 3.5–5.1)
SODIUM: 141 meq/L (ref 135–145)
Total Bilirubin: 0.7 mg/dL (ref 0.2–1.2)
Total Protein: 7.3 g/dL (ref 6.0–8.3)

## 2016-06-20 LAB — URINALYSIS, ROUTINE W REFLEX MICROSCOPIC
Bilirubin Urine: NEGATIVE
HGB URINE DIPSTICK: NEGATIVE
Ketones, ur: NEGATIVE
Leukocytes, UA: NEGATIVE
NITRITE: NEGATIVE
RBC / HPF: NONE SEEN (ref 0–?)
SPECIFIC GRAVITY, URINE: 1.015 (ref 1.000–1.030)
TOTAL PROTEIN, URINE-UPE24: NEGATIVE
Urine Glucose: NEGATIVE
Urobilinogen, UA: 0.2 (ref 0.0–1.0)
pH: 7 (ref 5.0–8.0)

## 2016-06-20 LAB — LIPID PANEL
CHOL/HDL RATIO: 3
CHOLESTEROL: 179 mg/dL (ref 0–200)
HDL: 52 mg/dL (ref >39.00)
LDL CALC: 107 mg/dL — AB (ref 0–99)
NONHDL: 127.32
Triglycerides: 104 mg/dL (ref 0.0–149.0)
VLDL: 20.8 mg/dL (ref 0.0–40.0)

## 2016-06-20 LAB — CBC
HCT: 44.6 % (ref 39.0–52.0)
Hemoglobin: 15.1 g/dL (ref 13.0–17.0)
MCHC: 33.8 g/dL (ref 30.0–36.0)
MCV: 90 fl (ref 78.0–100.0)
Platelets: 321 10*3/uL (ref 150.0–400.0)
RBC: 4.96 Mil/uL (ref 4.22–5.81)
RDW: 13.5 % (ref 11.5–15.5)
WBC: 9.8 10*3/uL (ref 4.0–10.5)

## 2016-06-20 LAB — HEMOGLOBIN A1C: Hgb A1c MFr Bld: 5.2 % (ref 4.6–6.5)

## 2016-06-20 LAB — TSH: TSH: 0.87 u[IU]/mL (ref 0.35–4.50)

## 2016-06-20 MED ORDER — AZITHROMYCIN 250 MG PO TABS: ORAL_TABLET | ORAL | 0 refills | Status: DC

## 2016-06-20 NOTE — Progress Notes (Signed)
Patient presents to clinic today for annual exam.  Patient is fasting for labs. Body mass index is 24.07 kg/m. Is not currently doing anything for exercise. Has joined a gym but only went a couple of times but has not returned. Endorses well-balanced diet overall.   Acute Concerns: Patient endorses dry cough for the past couple of weeks becoming more significant and productive over the past week. Denies fever, chills. Denies sinus pressure, PND, ear pain or tooth pain. Denies chest pain or SOB. Denies recent travel or sick contact.   Health Maintenance: Immunizations -- TDaP up-to-date. Declines flu shot.   Past Medical History:  Diagnosis Date  . Anxiety   . History of chicken pox    childhood  . History of kidney stones 2006    Past Surgical History:  Procedure Laterality Date  . NO PAST SURGERIES  12/22/2011    Current Outpatient Prescriptions on File Prior to Visit  Medication Sig Dispense Refill  . Multiple Vitamins-Minerals (MENS MULTIVITAMIN PLUS PO) Take by mouth daily.     No current facility-administered medications on file prior to visit.     Allergies  Allergen Reactions  . Amoxicillin Hives    Family History  Problem Relation Age of Onset  . Hyperlipidemia Mother     Father,  MGM  . Heart disease Father [id number]    cad-angioplasty  . Hypertension Father     MGM  . Colon cancer Paternal Grandfather [id number]  . Prostate cancer Maternal Grandfather   . Coronary artery disease Maternal Grandfather   . Diabetes Paternal Grandmother     Social History   Social History  . Marital status: Married    Spouse name: N/A  . Number of children: N/A  . Years of education: N/A   Occupational History  . CPA    Social History Main Topics  . Smoking status: Never Smoker  . Smokeless tobacco: Never Used  . Alcohol use No  . Drug use: No  . Sexual activity: Yes    Partners: Female   Other Topics Concern  . Not on file   Social History Narrative  . No narrative  on file   Review of Systems  Constitutional: Negative for fever and weight loss.  HENT: Negative for ear discharge, ear pain, hearing loss and tinnitus.   Eyes: Negative for blurred vision, double vision, photophobia and pain.  Respiratory: Negative for cough and shortness of breath.   Cardiovascular: Negative for chest pain and palpitations.  Gastrointestinal: Negative for abdominal pain, blood in stool, constipation, diarrhea, heartburn, melena, nausea and vomiting.  Genitourinary: Negative for dysuria, flank pain, frequency, hematuria and urgency.  Musculoskeletal: Negative for falls.  Neurological: Negative for dizziness, loss of consciousness and headaches.  Endo/Heme/Allergies: Negative for environmental allergies.  Psychiatric/Behavioral: Negative for depression, hallucinations, substance abuse and suicidal ideas. The patient is not nervous/anxious and does not have insomnia.    BP 122/80   Pulse 72   Temp 98.2 F (36.8 C) (Oral)   Resp 14   Ht 5\' 9"  (1.753 m)   Wt 163 lb (73.9 kg)   SpO2 98%   BMI 24.07 kg/m   Physical Exam  Constitutional: He is oriented to person, place, and time and well-developed, well-nourished, and in no distress.  HENT:  Head: Normocephalic and atraumatic.  Right Ear: External ear normal.  Left Ear: External ear normal.  Nose: Nose normal.  Mouth/Throat: Oropharynx is clear and moist. No oropharyngeal exudate.  Eyes: Conjunctivae and EOM  are normal. Pupils are equal, round, and reactive to light.  Neck: Neck supple. No thyromegaly present.  Cardiovascular: Normal rate, regular rhythm, normal heart sounds and intact distal pulses.   Pulmonary/Chest: Effort normal and breath sounds normal. No respiratory distress. He has no wheezes. He has no rales. He exhibits no tenderness.  Abdominal: Soft. Bowel sounds are normal. He exhibits no distension and no mass. There is no tenderness. There is no rebound and no guarding.  Genitourinary: Testes/scrotum  normal and penis normal. No discharge found.  Lymphadenopathy:    He has no cervical adenopathy.  Neurological: He is alert and oriented to person, place, and time.  Skin: Skin is warm and dry. No rash noted.  Psychiatric: Affect normal.  Vitals reviewed.  Assessment/Plan: Visit for preventive health examination Depression screen negative. Health Maintenance reviewed -- Declines flu. TDaP up-to-date. Preventive schedule discussed and handout given in AVS. Will obtain fasting labs today.   Acute bacterial bronchitis Rx Azithromycin.  Increase fluids.  Rest.  Saline nasal spray.  Probiotic.  Mucinex as directed.  Humidifier in bedroom.  Call or return to clinic if symptoms are not improving.     Piedad ClimesMartin, Syeda Prickett Cody, PA-C

## 2016-06-20 NOTE — Progress Notes (Signed)
Pre visit review using our clinic review tool, if applicable. No additional management support is needed unless otherwise documented below in the visit note. 

## 2016-06-20 NOTE — Assessment & Plan Note (Signed)
Depression screen negative. Health Maintenance reviewed -- Declines flu. TDaP up-to-date. Preventive schedule discussed and handout given in AVS. Will obtain fasting labs today.

## 2016-06-20 NOTE — Patient Instructions (Signed)
Please go to the lab for blood work.   Our office will call you with your results unless you have chosen to receive results via MyChart.  If your blood work is normal we will follow-up each year for physicals and as scheduled for chronic medical problems.  If anything is abnormal we will treat accordingly and get you in for a follow-up.  Please stay well hydrated and get plenty of rest.  Take Mucinex-DM for cough and congestion. Place a humidifier in the bedroom.    Preventive Care 31-39 Years, Male Preventive care refers to lifestyle choices and visits with your health care provider that can promote health and wellness. What does preventive care include?  A yearly physical exam. This is also called an annual well check.  Dental exams once or twice a year.  Routine eye exams. Ask your health care provider how often you should have your eyes checked.  Personal lifestyle choices, including:  Daily care of your teeth and gums.  Regular physical activity.  Eating a healthy diet.  Avoiding tobacco and drug use.  Limiting alcohol use.  Practicing safe sex. What happens during an annual well check? The services and screenings done by your health care provider during your annual well check will depend on your age, overall health, lifestyle risk factors, and family history of disease. Counseling  Your health care provider may ask you questions about your:  Alcohol use.  Tobacco use.  Drug use.  Emotional well-being.  Home and relationship well-being.  Sexual activity.  Eating habits.  Work and work Statistician. Screening  You may have the following tests or measurements:  Height, weight, and BMI.  Blood pressure.  Lipid and cholesterol levels. These may be checked every 5 years starting at age 20.  Diabetes screening. This is done by checking your blood sugar (glucose) after you have not eaten for a while (fasting).  Skin check.  Hepatitis C blood  test.  Hepatitis B blood test.  Sexually transmitted disease (STD) testing. Discuss your test results, treatment options, and if necessary, the need for more tests with your health care provider. Vaccines  Your health care provider may recommend certain vaccines, such as:  Influenza vaccine. This is recommended every year.  Tetanus, diphtheria, and acellular pertussis (Tdap, Td) vaccine. You may need a Td booster every 10 years.  Varicella vaccine. You may need this if you have not been vaccinated.  HPV vaccine. If you are 5 or younger, you may need three doses over 6 months.  Measles, mumps, and rubella (MMR) vaccine. You may need at least one dose of MMR.You may also need a second dose.  Pneumococcal 13-valent conjugate (PCV13) vaccine. You may need this if you have certain conditions and have not been vaccinated.  Pneumococcal polysaccharide (PPSV23) vaccine. You may need one or two doses if you smoke cigarettes or if you have certain conditions.  Meningococcal vaccine. One dose is recommended if you are age 2-21 years and a first-year college student living in a residence hall, or if you have one of several medical conditions. You may also need additional booster doses.  Hepatitis A vaccine. You may need this if you have certain conditions or if you travel or work in places where you may be exposed to hepatitis A.  Hepatitis B vaccine. You may need this if you have certain conditions or if you travel or work in places where you may be exposed to hepatitis B.  Haemophilus influenzae type b (Hib) vaccine.  You may need this if you have certain risk factors. Talk to your health care provider about which screenings and vaccines you need and how often you need them. This information is not intended to replace advice given to you by your health care provider. Make sure you discuss any questions you have with your health care provider. Document Released: 08/19/2001 Document Revised:  03/12/2016 Document Reviewed: 04/24/2015 Elsevier Interactive Patient Education  2017 [employer].  .

## 2016-06-20 NOTE — Assessment & Plan Note (Signed)
Rx Azithromycin.  Increase fluids.  Rest.  Saline nasal spray.  Probiotic.  Mucinex as directed.  Humidifier in bedroom.  Call or return to clinic if symptoms are not improving.  

## 2016-06-23 ENCOUNTER — Encounter: Payer: Self-pay | Admitting: Emergency Medicine

## 2016-07-04 ENCOUNTER — Encounter: Payer: Self-pay | Admitting: Family Medicine

## 2016-07-04 ENCOUNTER — Ambulatory Visit (INDEPENDENT_AMBULATORY_CARE_PROVIDER_SITE_OTHER): Payer: Managed Care, Other (non HMO) | Admitting: Family Medicine

## 2016-07-04 VITALS — BP 121/81 | HR 78 | Temp 99.6°F | Resp 17 | Ht 69.0 in | Wt 160.4 lb

## 2016-07-04 DIAGNOSIS — R509 Fever, unspecified: Secondary | ICD-10-CM | POA: Diagnosis not present

## 2016-07-04 DIAGNOSIS — J101 Influenza due to other identified influenza virus with other respiratory manifestations: Secondary | ICD-10-CM

## 2016-07-04 LAB — POC INFLUENZA A&B (BINAX/QUICKVUE)
INFLUENZA B, POC: NEGATIVE
Influenza A, POC: POSITIVE — AB

## 2016-07-04 MED ORDER — OSELTAMIVIR PHOSPHATE 75 MG PO CAPS: 75.0000 mg | ORAL_CAPSULE | Freq: Two times a day (BID) | ORAL | 0 refills | Status: DC

## 2016-07-04 NOTE — Progress Notes (Signed)
Pre visit review using our clinic review tool, if applicable. No additional management support is needed unless otherwise documented below in the visit note. 

## 2016-07-04 NOTE — Addendum Note (Signed)
Addended by: Geannie RisenBRODMERKEL, Helena Sardo L on: 07/04/2016 02:28 PM   Modules accepted: Orders

## 2016-07-04 NOTE — Patient Instructions (Signed)
Follow up as needed Start Tamiflu twice daily x5 days Drink plenty of fluids REST!! Alternate tylenol and ibuprofen for fever/body aches Call with any questions or concerns Hang in there! Happy New Year!!

## 2016-07-04 NOTE — Progress Notes (Signed)
   Subjective:    Patient ID: Alejandro Weber, male    DOB: 07/13/1985, 30 y.o.   MRN: [id number]  HPI Fever- sxs started last night, Tm 102.4.  Son tested + for flu on Tuesday.  + body aches, chills, skin hurts to touch.  Some sinus pressure, HA, nasal congestion.  + cough since Thanksgiving.  No sore throat.  Mild nausea, no vomiting.   Review of Systems For ROS see HPI     Objective:   Physical Exam  Constitutional: He is oriented to person, place, and time. He appears well-developed and well-nourished. No distress.  Obviously uncomfortable  HENT:  Head: Normocephalic and atraumatic.  Right Ear: Tympanic membrane normal.  Left Ear: Tympanic membrane normal.  Nose: Right sinus exhibits no maxillary sinus tenderness and no frontal sinus tenderness. Left sinus exhibits no maxillary sinus tenderness and no frontal sinus tenderness.  Mouth/Throat: Oropharynx is clear and moist. No oropharyngeal exudate.  Neck: Normal range of motion. Neck supple.  Cardiovascular: Normal rate, regular rhythm and normal heart sounds.   Pulmonary/Chest: Effort normal and breath sounds normal. No respiratory distress. He has no wheezes. He has no rales.  Lymphadenopathy:    He has no cervical adenopathy.  Neurological: He is alert and oriented to person, place, and time.  Skin: Skin is warm and dry. No rash noted.  Vitals reviewed.         Assessment & Plan:  Influenza- pt's sxs and lack of bacterial infxn on PE are consistent w/ flu.  Rapid test confirms.  Start Tamiflu.  Reviewed supportive care and red flags that should prompt return.  Pt expressed understanding and is in agreement w/ plan.

## 2017-06-22 ENCOUNTER — Encounter: Payer: Managed Care, Other (non HMO) | Admitting: Physician Assistant

## 2017-11-25 ENCOUNTER — Encounter: Payer: Self-pay | Admitting: General Practice

## 2018-01-27 ENCOUNTER — Ambulatory Visit (INDEPENDENT_AMBULATORY_CARE_PROVIDER_SITE_OTHER): Payer: 59 | Admitting: Physician Assistant

## 2018-01-27 ENCOUNTER — Encounter: Payer: Self-pay | Admitting: Physician Assistant

## 2018-01-27 ENCOUNTER — Other Ambulatory Visit: Payer: Self-pay

## 2018-01-27 VITALS — BP 118/78 | HR 63 | Temp 98.2°F | Resp 16 | Ht 68.0 in | Wt 158.8 lb

## 2018-01-27 DIAGNOSIS — Z0000 Encounter for general adult medical examination without abnormal findings: Secondary | ICD-10-CM

## 2018-01-27 DIAGNOSIS — K219 Gastro-esophageal reflux disease without esophagitis: Secondary | ICD-10-CM | POA: Insufficient documentation

## 2018-01-27 DIAGNOSIS — Z8249 Family history of ischemic heart disease and other diseases of the circulatory system: Secondary | ICD-10-CM | POA: Diagnosis not present

## 2018-01-27 LAB — LIPID PANEL
Cholesterol: 213 mg/dL — ABNORMAL HIGH (ref 0–200)
HDL: 58.9 mg/dL (ref >39.00)
LDL CALC: 127 mg/dL — AB (ref 0–99)
NonHDL: 153.92
TRIGLYCERIDES: 136 mg/dL (ref 0.0–149.0)
Total CHOL/HDL Ratio: 4
VLDL: 27.2 mg/dL (ref 0.0–40.0)

## 2018-01-27 LAB — COMPREHENSIVE METABOLIC PANEL
ALT: 16 U/L (ref 0–53)
AST: 15 U/L (ref 0–37)
Albumin: 4.9 g/dL (ref 3.5–5.2)
Alkaline Phosphatase: 58 U/L (ref 39–117)
BUN: 16 mg/dL (ref 6–23)
CALCIUM: 9.7 mg/dL (ref 8.4–10.5)
CHLORIDE: 103 meq/L (ref 96–112)
CO2: 29 meq/L (ref 19–32)
Creatinine, Ser: 1.1 mg/dL (ref 0.40–1.50)
GFR: 81.78 mL/min (ref >60.00)
Glucose, Bld: 90 mg/dL (ref 70–99)
POTASSIUM: 4.2 meq/L (ref 3.5–5.1)
Sodium: 138 mEq/L (ref 135–145)
Total Bilirubin: 0.9 mg/dL (ref 0.2–1.2)
Total Protein: 8 g/dL (ref 6.0–8.3)

## 2018-01-27 LAB — CBC WITH DIFFERENTIAL/PLATELET
BASOS PCT: 0.3 % (ref 0.0–3.0)
Basophils Absolute: 0 10*3/uL (ref 0.0–0.1)
EOS ABS: 0.1 10*3/uL (ref 0.0–0.7)
EOS PCT: 1.3 % (ref 0.0–5.0)
HCT: 46.9 % (ref 39.0–52.0)
Hemoglobin: 16 g/dL (ref 13.0–17.0)
LYMPHS PCT: 38.2 % (ref 12.0–46.0)
Lymphs Abs: 2.5 10*3/uL (ref 0.7–4.0)
MCHC: 34.2 g/dL (ref 30.0–36.0)
MCV: 90.5 fl (ref 78.0–100.0)
Monocytes Absolute: 0.7 10*3/uL (ref 0.1–1.0)
Monocytes Relative: 9.8 % (ref 3.0–12.0)
NEUTROS ABS: 3.3 10*3/uL (ref 1.4–7.7)
Neutrophils Relative %: 50.4 % (ref 43.0–77.0)
PLATELETS: 309 10*3/uL (ref 150.0–400.0)
RBC: 5.18 Mil/uL (ref 4.22–5.81)
RDW: 13.3 % (ref 11.5–15.5)
WBC: 6.6 10*3/uL (ref 4.0–10.5)

## 2018-01-27 LAB — HEMOGLOBIN A1C: Hgb A1c MFr Bld: 5.3 % (ref 4.6–6.5)

## 2018-01-27 NOTE — Assessment & Plan Note (Signed)
Depression screen negative. Health Maintenance reviewed. Preventive schedule discussed and handout given in AVS. Will obtain fasting labs today.  

## 2018-01-27 NOTE — Assessment & Plan Note (Signed)
EKG within normal limits. Will check fasting lipids and glucose levels. Discussed consideration for EST in the next year or so giving significant family history. He is giving this some thought. Reviewed proper diet and exercise regimen.

## 2018-01-27 NOTE — Patient Instructions (Signed)
Please go to the lab for blood work.  Our office will call you with your results unless you have chosen to receive results via MyChart. If your blood work is normal we will follow-up each year for physicals and as scheduled for chronic medical problems. If anything is abnormal we will treat accordingly and get you in for a follow-up.  Your EKG looks good today. We are checking labs to assess your cardiac risk. We may want to consider a stress test in the next year giving your family history.   Your current symptoms seem related to acid production. Limit late-night eating, fast foods and greasy meals.  See the dietary recommendations below. Get OTC Prilosec (Omeprazole) to take daily over the next 2 weeks to see if symptoms resolve. If not improving, please call me.    Food Choices for Gastroesophageal Reflux Disease, Adult When you have gastroesophageal reflux disease (GERD), the foods you eat and your eating habits are very important. Choosing the right foods can help ease your discomfort. What guidelines do I need to follow?  Choose fruits, vegetables, whole grains, and low-fat dairy products.  Choose low-fat meat, fish, and poultry.  Limit fats such as oils, salad dressings, butter, nuts, and avocado.  Keep a food diary. This helps you identify foods that cause symptoms.  Avoid foods that cause symptoms. These may be different for everyone.  Eat small meals often instead of 3 large meals a day.  Eat your meals slowly, in a place where you are relaxed.  Limit fried foods.  Cook foods using methods other than frying.  Avoid drinking alcohol.  Avoid drinking large amounts of liquids with your meals.  Avoid bending over or lying down until 2-3 hours after eating. What foods are not recommended? These are some foods and drinks that may make your symptoms worse: Vegetables Tomatoes. Tomato juice. Tomato and spaghetti sauce. Chili peppers. Onion and garlic.  Horseradish. Fruits Oranges, grapefruit, and lemon (fruit and juice). Meats High-fat meats, fish, and poultry. This includes hot dogs, ribs, ham, sausage, salami, and bacon. Dairy Whole milk and chocolate milk. Sour cream. Cream. Butter. Ice cream. Cream cheese. Drinks Coffee and tea. Bubbly (carbonated) drinks or energy drinks. Condiments Hot sauce. Barbecue sauce. Sweets/Desserts Chocolate and cocoa. Donuts. Peppermint and spearmint. Fats and Oils High-fat foods. This includes [place] fries and potato chips. Other Vinegar. Strong spices. This includes black pepper, white pepper, red pepper, cayenne, curry powder, cloves, ginger, and chili powder. The items listed above may not be a complete list of foods and drinks to avoid. Contact your dietitian for more information. This information is not intended to replace advice given to you by your health care provider. Make sure you discuss any questions you have with your health care provider. Document Released: 12/23/2011 Document Revised: 11/29/2015 Document Reviewed: 04/27/2013 Elsevier Interactive Patient Education  2017 [employer]. .  Preventive Care 18-39 Years, Male Preventive care refers to lifestyle choices and visits with your health care provider that can promote health and wellness. What does preventive care include?  A yearly physical exam. This is also called an annual well check.  Dental exams once or twice a year.  Routine eye exams. Ask your health care provider how often you should have your eyes checked.  Personal lifestyle choices, including: ? Daily care of your teeth and gums. ? Regular physical activity. ? Eating a healthy diet. ? Avoiding tobacco and drug use. ? Limiting alcohol use. ? Practicing safe sex. What happens during  an annual well check? The services and screenings done by your health care provider during your annual well check will depend on your age, overall health, lifestyle risk factors, and  family history of disease. Counseling Your health care provider may ask you questions about your:  Alcohol use.  Tobacco use.  Drug use.  Emotional well-being.  Home and relationship well-being.  Sexual activity.  Eating habits.  Work and work Statistician.  Screening You may have the following tests or measurements:  Height, weight, and BMI.  Blood pressure.  Lipid and cholesterol levels. These may be checked every 5 years starting at age 30.  Diabetes screening. This is done by checking your blood sugar (glucose) after you have not eaten for a while (fasting).  Skin check.  Hepatitis C blood test.  Hepatitis B blood test.  Sexually transmitted disease (STD) testing.  Discuss your test results, treatment options, and if necessary, the need for more tests with your health care provider. Vaccines Your health care provider may recommend certain vaccines, such as:  Influenza vaccine. This is recommended every year.  Tetanus, diphtheria, and acellular pertussis (Tdap, Td) vaccine. You may need a Td booster every 10 years.  Varicella vaccine. You may need this if you have not been vaccinated.  HPV vaccine. If you are 76 or younger, you may need three doses over 6 months.  Measles, mumps, and rubella (MMR) vaccine. You may need at least one dose of MMR.You may also need a second dose.  Pneumococcal 13-valent conjugate (PCV13) vaccine. You may need this if you have certain conditions and have not been vaccinated.  Pneumococcal polysaccharide (PPSV23) vaccine. You may need one or two doses if you smoke cigarettes or if you have certain conditions.  Meningococcal vaccine. One dose is recommended if you are age 27-21 years and a first-year college student living in a residence hall, or if you have one of several medical conditions. You may also need additional booster doses.  Hepatitis A vaccine. You may need this if you have certain conditions or if you travel or  work in places where you may be exposed to hepatitis A.  Hepatitis B vaccine. You may need this if you have certain conditions or if you travel or work in places where you may be exposed to hepatitis B.  Haemophilus influenzae type b (Hib) vaccine. You may need this if you have certain risk factors.  Talk to your health care provider about which screenings and vaccines you need and how often you need them. This information is not intended to replace advice given to you by your health care provider. Make sure you discuss any questions you have with your health care provider. Document Released: 08/19/2001 Document Revised: 03/12/2016 Document Reviewed: 04/24/2015 Elsevier Interactive Patient Education  [employer].

## 2018-01-27 NOTE — Assessment & Plan Note (Signed)
+   heartburn and occasional atypical chest pain in the morning. Discussed avoidance of late-night eating. Will start GERD diet. Will also start OTC Prilosec x 2 weeks.

## 2018-01-27 NOTE — Progress Notes (Signed)
Patient presents to clinic today for annual exam.  Patient is fasting for labs.  Diet -- Typically lunch is grab-and-go and dinner is a home-cooked meal that is well-balanced.  Exercise -- Is trying to stay physically active when not at work.   Acute Concerns: Patient endorses recently noting some episodes of chest discomfort, typically in the morning that is occasionally associated with heart burn. Denies any SOB, LH, DZ or syncope. Episodes are typically at rest. Does not note any worsening with exertion. Denies nausea/vomiting. Denies excess anxiety or depressed mood. Feels he has some stressors but these are easily managed. Patient does have family history of significant early CAD so this concerns him slightly.   Health Maintenance: Immunizations --up-to-date.   Past Medical History:  Diagnosis Date  . Anxiety   . History of chicken pox    childhood  . History of kidney stones 2006    Past Surgical History:  Procedure Laterality Date  . NO PAST SURGERIES  12/22/2011    Current Outpatient Medications on File Prior to Visit  Medication Sig Dispense Refill  . Multiple Vitamins-Minerals (MENS MULTIVITAMIN PLUS PO) Take by mouth daily.     No current facility-administered medications on file prior to visit.     Allergies  Allergen Reactions  . Amoxicillin Hives    Family History  Problem Relation Age of Onset  . Hyperlipidemia Mother        Father,  MGM  . Heart disease Father 90       cad-angioplasty  . Hypertension Father        MGM  . Colon cancer Paternal Grandfather 74  . Prostate cancer Maternal Grandfather   . Coronary artery disease Maternal Grandfather   . Diabetes Paternal Grandmother     Social History   Socioeconomic History  . Marital status: Married    Spouse name: Not on file  . Number of children: Not on file  . Years of education: Not on file  . Highest education level: Not on file  Occupational History  . Occupation: IT trainer  Social Needs   . Financial resource strain: Not on file  . Food insecurity:    Worry: Not on file    Inability: Not on file  . Transportation needs:    Medical: Not on file    Non-medical: Not on file  Tobacco Use  . Smoking status: Never Smoker  . Smokeless tobacco: Never Used  Substance and Sexual Activity  . Alcohol use: No    Alcohol/week: 0.0 oz  . Drug use: No  . Sexual activity: Yes    Partners: Female  Lifestyle  . Physical activity:    Days per week: Not on file    Minutes per session: Not on file  . Stress: Not on file  Relationships  . Social connections:    Talks on phone: Not on file    Gets together: Not on file    Attends religious service: Not on file    Active member of club or organization: Not on file    Attends meetings of clubs or organizations: Not on file    Relationship status: Not on file  . Intimate partner violence:    Fear of current or ex partner: Not on file    Emotionally abused: Not on file    Physically abused: Not on file    Forced sexual activity: Not on file  Other Topics Concern  . Not on file  Social History Narrative  .  Not on file   Review of Systems  Constitutional: Negative for fever and weight loss.  HENT: Negative for ear discharge, ear pain, hearing loss and tinnitus.   Eyes: Negative for blurred vision, double vision, photophobia and pain.  Respiratory: Negative for cough and shortness of breath.   Cardiovascular: Positive for chest pain. Negative for palpitations.  Gastrointestinal: Positive for heartburn (occasional). Negative for abdominal pain, blood in stool, constipation, diarrhea, melena, nausea and vomiting.  Genitourinary: Negative for dysuria, flank pain, frequency, hematuria and urgency.  Musculoskeletal: Negative for falls.  Neurological: Negative for dizziness, loss of consciousness and headaches.  Endo/Heme/Allergies: Negative for environmental allergies.  Psychiatric/Behavioral: Negative for depression, hallucinations,  substance abuse and suicidal ideas. The patient is not nervous/anxious and does not have insomnia.    BP 118/78   Pulse 63   Temp 98.2 F (36.8 C) (Oral)   Resp 16   Ht 5\' 8"  (1.727 m)   Wt 158 lb 12.8 oz (72 kg)   SpO2 98%   BMI 24.15 kg/m   Physical Exam  Constitutional: He is oriented to person, place, and time. He appears well-developed and well-nourished. No distress.  HENT:  Head: Normocephalic and atraumatic.  Right Ear: Tympanic membrane, external ear and ear canal normal.  Left Ear: Tympanic membrane, external ear and ear canal normal.  Nose: Nose normal.  Mouth/Throat: Oropharynx is clear and moist and mucous membranes are normal. No posterior oropharyngeal edema or posterior oropharyngeal erythema.  Eyes: Pupils are equal, round, and reactive to light. Conjunctivae are normal.  Neck: Neck supple. No thyromegaly present.  Cardiovascular: Normal rate, regular rhythm, normal heart sounds and intact distal pulses.  Pulmonary/Chest: Effort normal and breath sounds normal. No respiratory distress. He has no wheezes. He has no rales. He exhibits no tenderness.  Abdominal: Soft. Bowel sounds are normal. He exhibits no distension and no mass. There is no tenderness. There is no rebound and no guarding.  Lymphadenopathy:    He has no cervical adenopathy.  Neurological: He is alert and oriented to person, place, and time. No cranial nerve deficit.  Skin: Skin is warm and dry. No rash noted. He is not diaphoretic.  Psychiatric: He has a normal mood and affect.  Vitals reviewed.  Assessment/Plan: Gastroesophageal reflux disease without esophagitis + heartburn and occasional atypical chest pain in the morning. Discussed avoidance of late-night eating. Will start GERD diet. Will also start OTC Prilosec x 2 weeks.   Family history of early CAD EKG within normal limits. Will check fasting lipids and glucose levels. Discussed consideration for EST in the next year or so giving  significant family history. He is giving this some thought. Reviewed proper diet and exercise regimen.  Visit for preventive health examination Depression screen negative. Health Maintenance reviewed. Preventive schedule discussed and handout given in AVS. Will obtain fasting labs today.     Piedad ClimesWilliam Cody Sadarius Norman, PA-C

## 2019-05-02 ENCOUNTER — Other Ambulatory Visit: Payer: Self-pay

## 2019-05-02 ENCOUNTER — Ambulatory Visit (INDEPENDENT_AMBULATORY_CARE_PROVIDER_SITE_OTHER): Payer: 59 | Admitting: Physician Assistant

## 2019-05-02 ENCOUNTER — Encounter: Payer: Self-pay | Admitting: Physician Assistant

## 2019-05-02 VITALS — BP 126/82 | HR 73 | Temp 98.7°F | Resp 16 | Ht 69.0 in | Wt 172.0 lb

## 2019-05-02 DIAGNOSIS — F411 Generalized anxiety disorder: Secondary | ICD-10-CM | POA: Diagnosis not present

## 2019-05-02 DIAGNOSIS — Z0000 Encounter for general adult medical examination without abnormal findings: Secondary | ICD-10-CM

## 2019-05-02 DIAGNOSIS — H6122 Impacted cerumen, left ear: Secondary | ICD-10-CM

## 2019-05-02 DIAGNOSIS — K219 Gastro-esophageal reflux disease without esophagitis: Secondary | ICD-10-CM

## 2019-05-02 LAB — CBC WITH DIFFERENTIAL/PLATELET
Basophils Absolute: 0 10*3/uL (ref 0.0–0.1)
Basophils Relative: 0.3 % (ref 0.0–3.0)
Eosinophils Absolute: 0.2 10*3/uL (ref 0.0–0.7)
Eosinophils Relative: 2.2 % (ref 0.0–5.0)
HCT: 43.9 % (ref 39.0–52.0)
Hemoglobin: 15.2 g/dL (ref 13.0–17.0)
Lymphocytes Relative: 38.2 % (ref 12.0–46.0)
Lymphs Abs: 2.7 10*3/uL (ref 0.7–4.0)
MCHC: 34.7 g/dL (ref 30.0–36.0)
MCV: 89 fl (ref 78.0–100.0)
Monocytes Absolute: 0.7 10*3/uL (ref 0.1–1.0)
Monocytes Relative: 9.6 % (ref 3.0–12.0)
Neutro Abs: 3.6 10*3/uL (ref 1.4–7.7)
Neutrophils Relative %: 49.7 % (ref 43.0–77.0)
Platelets: 287 10*3/uL (ref 150.0–400.0)
RBC: 4.93 Mil/uL (ref 4.22–5.81)
RDW: 13.3 % (ref 11.5–15.5)
WBC: 7.2 10*3/uL (ref 4.0–10.5)

## 2019-05-02 LAB — HEMOGLOBIN A1C: Hgb A1c MFr Bld: 5.2 % (ref 4.6–6.5)

## 2019-05-02 LAB — COMPREHENSIVE METABOLIC PANEL
ALT: 20 U/L (ref 0–53)
AST: 23 U/L (ref 0–37)
Albumin: 4.8 g/dL (ref 3.5–5.2)
Alkaline Phosphatase: 56 U/L (ref 39–117)
BUN: 16 mg/dL (ref 6–23)
CO2: 25 mEq/L (ref 19–32)
Calcium: 9.4 mg/dL (ref 8.4–10.5)
Chloride: 105 mEq/L (ref 96–112)
Creatinine, Ser: 0.99 mg/dL (ref 0.40–1.50)
GFR: 86.25 mL/min (ref >60.00)
Glucose, Bld: 83 mg/dL (ref 70–99)
Potassium: 4.2 mEq/L (ref 3.5–5.1)
Sodium: 139 mEq/L (ref 135–145)
Total Bilirubin: 0.5 mg/dL (ref 0.2–1.2)
Total Protein: 7.7 g/dL (ref 6.0–8.3)

## 2019-05-02 LAB — LIPID PANEL
Cholesterol: 208 mg/dL — ABNORMAL HIGH (ref 0–200)
HDL: 57 mg/dL (ref >39.00)
LDL Cholesterol: 136 mg/dL — ABNORMAL HIGH (ref 0–99)
NonHDL: 150.69
Total CHOL/HDL Ratio: 4
Triglycerides: 71 mg/dL (ref 0.0–149.0)
VLDL: 14.2 mg/dL (ref 0.0–40.0)

## 2019-05-02 MED ORDER — ESCITALOPRAM OXALATE 10 MG PO TABS: 10.0000 mg | ORAL_TABLET | Freq: Every day | ORAL | 3 refills | Status: DC

## 2019-05-02 NOTE — Progress Notes (Signed)
Patient presents to clinic today for annual exam.  Patient is fasting for labs.  Diet -- reports eating out for lunch, cooking meals at home for dinner  Exercise --  3-4 mile walk 1-2x/week   Anxiety -- interested in restarting Lexapro, came off of Lexapro in mid-2017. Reports feeling overwhelmed, anger/irritability with anxiety. Denies panic attack. Denies depressed mood or anhedonia. Denies SI/HI  History of reflux 1-2 x week -- TUMS for relief.  Acute Concerns: Change in hearing -- decreased hearing in L ear x 1 month, no trauma to ear, no recent swimming or exposure to loud sounds. Has been trying to clean ears with Qtip without much success. Denies symptoms of R ear.   Health Maintenance: Immunizations -- declines flu shot today   Past Medical History:  Diagnosis Date  . Anxiety   . History of chicken pox    childhood  . History of kidney stones 2006    Past Surgical History:  Procedure Laterality Date  . NO PAST SURGERIES  12/22/2011    Current Outpatient Medications on File Prior to Visit  Medication Sig Dispense Refill  . Multiple Vitamins-Minerals (MENS MULTIVITAMIN PLUS PO) Take by mouth daily.     No current facility-administered medications on file prior to visit.     Allergies  Allergen Reactions  . Amoxicillin Hives    Family History  Problem Relation Age of Onset  . Hyperlipidemia Mother        Father,  MGM  . Heart disease Father 37       cad-angioplasty  . Hypertension Father        MGM  . Colon cancer Paternal Grandfather 35  . Prostate cancer Maternal Grandfather   . Coronary artery disease Maternal Grandfather   . Diabetes Paternal Grandmother     Social History   Socioeconomic History  . Marital status: Married    Spouse name: Not on file  . Number of children: Not on file  . Years of education: Not on file  . Highest education level: Not on file  Occupational History  . Occupation: Engineer, maintenance (IT)  Social Needs  . Financial resource  strain: Not on file  . Food insecurity    Worry: Not on file    Inability: Not on file  . Transportation needs    Medical: Not on file    Non-medical: Not on file  Tobacco Use  . Smoking status: Never Smoker  . Smokeless tobacco: Never Used  Substance and Sexual Activity  . Alcohol use: No    Alcohol/week: 0.0 standard drinks  . Drug use: No  . Sexual activity: Yes    Partners: Female  Lifestyle  . Physical activity    Days per week: Not on file    Minutes per session: Not on file  . Stress: Not on file  Relationships  . Social Herbalist on phone: Not on file    Gets together: Not on file    Attends religious service: Not on file    Active member of club or organization: Not on file    Attends meetings of clubs or organizations: Not on file    Relationship status: Not on file  . Intimate partner violence    Fear of current or ex partner: Not on file    Emotionally abused: Not on file    Physically abused: Not on file    Forced sexual activity: Not on file  Other Topics Concern  . Not  on file  Social History Narrative  . Not on file   Review of Systems  Constitutional: Negative for chills, fever and weight loss.  HENT: Positive for hearing loss (left ear) and tinnitus. Negative for congestion, ear discharge, ear pain and sore throat.   Eyes: Negative for blurred vision and double vision.  Respiratory: Negative for cough and shortness of breath.   Cardiovascular: Negative for chest pain and palpitations.  Gastrointestinal: Positive for heartburn (1-2x/week). Negative for constipation, diarrhea, nausea and vomiting.  Genitourinary: Negative for dysuria, frequency and urgency.  Neurological: Negative for dizziness and headaches.  Psychiatric/Behavioral: Negative for depression. The patient is nervous/anxious. The patient does not have insomnia.    BP 126/82   Pulse 73   Temp 98.7 F (37.1 C) (Temporal)   Resp 16   Ht 5\' 9"  (1.753 m)   Wt 172 lb (78 kg)    SpO2 99%   BMI 25.40 kg/m   Physical Exam Vitals signs reviewed.  Constitutional:      General: He is not in acute distress.    Appearance: He is well-developed. He is not diaphoretic.  HENT:     Head: Normocephalic and atraumatic.     Right Ear: Tympanic membrane, ear canal and external ear normal.     Left Ear: Tympanic membrane, ear canal and external ear normal.     Nose: Nose normal.     Mouth/Throat:     Pharynx: No posterior oropharyngeal erythema.  Eyes:     Conjunctiva/sclera: Conjunctivae normal.     Pupils: Pupils are equal, round, and reactive to light.  Neck:     Musculoskeletal: Neck supple.     Thyroid: No thyromegaly.  Cardiovascular:     Rate and Rhythm: Normal rate and regular rhythm.     Heart sounds: Normal heart sounds.  Pulmonary:     Effort: Pulmonary effort is normal. No respiratory distress.     Breath sounds: Normal breath sounds. No wheezing or rales.  Chest:     Chest wall: No tenderness.  Abdominal:     General: Bowel sounds are normal. There is no distension.     Palpations: Abdomen is soft. There is no mass.     Tenderness: There is no abdominal tenderness. There is no guarding or rebound.  Lymphadenopathy:     Cervical: No cervical adenopathy.  Skin:    General: Skin is warm and dry.     Findings: No rash.  Neurological:     Mental Status: He is alert and oriented to person, place, and time.     Cranial Nerves: No cranial nerve deficit.    Assessment/Plan: 1. Visit for preventive health examination Depression screen negative. Health Maintenance reviewed. Preventive schedule discussed and handout given in AVS. Will obtain fasting labs today.   2. Gastroesophageal reflux disease without esophagitis Discussed GERD diet. Recommend limiting TUMS. Start OTC Pepcid as needed. Will monitor.   3. GAD (generalized anxiety disorder) Discussed mindfulness medication and apps for this. Will restart Lexapro at 10 mg daily. Follow-up via  video visit in 4-6 weeks.   4. Hearing loss of left ear due to cerumen impaction Cerumen impaction removed via lavage without complication as we were unable to manually retrieve. Home instructions given.    , PA-C

## 2019-05-02 NOTE — Patient Instructions (Signed)
Please go to the lab for blood work.   Our office will call you with your results unless you have chosen to receive results via MyChart.  If your blood work is normal we will follow-up each year for physicals and as scheduled for chronic medical problems.  If anything is abnormal we will treat accordingly and get you in for a follow-up.  Please start the Lexapro once daily for anxiety.   Also look into downloading the Calm, HeadSpace or [place] apps on your phone to work on some mindfulness training exercises -- there are also workbooks for this you can get on [employer]. These may seem silly at first but can make a big difference over time as you learn to change your relationship to your thoughts and stressors.   We will follow-up in 4-6 weeks via video visit for your anxiety.  For reflux, please follow the diet below. Try OTC Pepcid as needed for reflux symptoms.   Food Choices for Gastroesophageal Reflux Disease, Adult When you have gastroesophageal reflux disease (GERD), the foods you eat and your eating habits are very important. Choosing the right foods can help ease your discomfort. Think about working with a nutrition specialist (dietitian) to help you make good choices. What are tips for following this plan?  Meals  Choose healthy foods that are low in fat, such as fruits, vegetables, whole grains, low-fat dairy products, and lean meat, fish, and poultry.  Eat small meals often instead of 3 large meals a day. Eat your meals slowly, and in a place where you are relaxed. Avoid bending over or lying down until 2-3 hours after eating.  Avoid eating meals 2-3 hours before bed.  Avoid drinking a lot of liquid with meals.  Cook foods using methods other than frying. Bake, grill, or broil food instead.  Avoid or limit: ? Chocolate. ? Peppermint or spearmint. ? Alcohol. ? Pepper. ? Black and decaffeinated coffee. ? Black and decaffeinated tea. ? Bubbly (carbonated) soft drinks.  ? Caffeinated energy drinks and soft drinks.  Limit high-fat foods such as: ? Fatty meat or fried foods. ? Whole milk, cream, butter, or ice cream. ? Nuts and nut butters. ? Pastries, donuts, and sweets made with butter or shortening.  Avoid foods that cause symptoms. These foods may be different for everyone. Common foods that cause symptoms include: ? Tomatoes. ? Oranges, lemons, and limes. ? Peppers. ? Spicy food. ? Onions and garlic. ? Vinegar. Lifestyle  Maintain a healthy weight. Ask your doctor what weight is healthy for you. If you need to lose weight, work with your doctor to do so safely.  Exercise for at least 30 minutes for 5 or more days each week, or as told by your doctor.  Wear loose-fitting clothes.  Do not smoke. If you need help quitting, ask your doctor.  Sleep with the head of your bed higher than your feet. Use a wedge under the mattress or blocks under the bed frame to raise the head of the bed. Summary  When you have gastroesophageal reflux disease (GERD), food and lifestyle choices are very important in easing your symptoms.  Eat small meals often instead of 3 large meals a day. Eat your meals slowly, and in a place where you are relaxed.  Limit high-fat foods such as fatty meat or fried foods.  Avoid bending over or lying down until 2-3 hours after eating.  Avoid peppermint and spearmint, caffeine, alcohol, and chocolate. This information is not intended to replace  advice given to you by your health care provider. Make sure you discuss any questions you have with your health care provider. Document Released: 12/23/2011 Document Revised: 10/14/2018 Document Reviewed: 07/29/2016 Elsevier Patient Education  2020 [employer] 20-55 Years Old, Male Preventive care refers to lifestyle choices and visits with your health care provider that can promote health and wellness. This includes:  A yearly physical exam. This is also called  an annual well check.  Regular dental and eye exams.  Immunizations.  Screening for certain conditions.  Healthy lifestyle choices, such as eating a healthy diet, getting regular exercise, not using drugs or products that contain nicotine and tobacco, and limiting alcohol use. What can I expect for my preventive care visit? Physical exam Your health care provider will check:  Height and weight. These may be used to calculate body mass index (BMI), which is a measurement that tells if you are at a healthy weight.  Heart rate and blood pressure.  Your skin for abnormal spots. Counseling Your health care provider may ask you questions about:  Alcohol, tobacco, and drug use.  Emotional well-being.  Home and relationship well-being.  Sexual activity.  Eating habits.  Work and work Statistician. What immunizations do I need?  Influenza (flu) vaccine  This is recommended every year. Tetanus, diphtheria, and pertussis (Tdap) vaccine  You may need a Td booster every 10 years. Varicella (chickenpox) vaccine  You may need this vaccine if you have not already been vaccinated. Human papillomavirus (HPV) vaccine  If recommended by your health care provider, you may need three doses over 6 months. Measles, mumps, and rubella (MMR) vaccine  You may need at least one dose of MMR. You may also need a second dose. Meningococcal conjugate (MenACWY) vaccine  One dose is recommended if you are 41-9 years old and a Market researcher living in a residence hall, or if you have one of several medical conditions. You may also need additional booster doses. Pneumococcal conjugate (PCV13) vaccine  You may need this if you have certain conditions and were not previously vaccinated. Pneumococcal polysaccharide (PPSV23) vaccine  You may need one or two doses if you smoke cigarettes or if you have certain conditions. Hepatitis A vaccine  You may need this if you have certain  conditions or if you travel or work in places where you may be exposed to hepatitis A. Hepatitis B vaccine  You may need this if you have certain conditions or if you travel or work in places where you may be exposed to hepatitis B. Haemophilus influenzae type b (Hib) vaccine  You may need this if you have certain risk factors. You may receive vaccines as individual doses or as more than one vaccine together in one shot (combination vaccines). Talk with your health care provider about the risks and benefits of combination vaccines. What tests do I need? Blood tests  Lipid and cholesterol levels. These may be checked every 5 years starting at age 8.  Hepatitis C test.  Hepatitis B test. Screening   Diabetes screening. This is done by checking your blood sugar (glucose) after you have not eaten for a while (fasting).  Sexually transmitted disease (STD) testing. Talk with your health care provider about your test results, treatment options, and if necessary, the need for more tests. Follow these instructions at home: Eating and drinking   Eat a diet that includes fresh fruits and vegetables, whole grains, lean protein, and low-fat dairy products.  Take vitamin and mineral supplements as recommended by your health care provider.  Do not drink alcohol if your health care provider tells you not to drink.  If you drink alcohol: ? Limit how much you have to 0-2 drinks a day. ? Be aware of how much alcohol is in your drink. In the [place] one drink equals one 12 oz bottle of beer (355 mL), one 5 oz glass of wine (148 mL), or one 1 oz glass of hard liquor (44 mL). Lifestyle  Take daily care of your teeth and gums.  Stay active. Exercise for at least 30 minutes on 5 or more days each week.  Do not use any products that contain nicotine or tobacco, such as cigarettes, e-cigarettes, and chewing tobacco. If you need help quitting, ask your health care provider.  If you are sexually  active, practice safe sex. Use a condom or other form of protection to prevent STIs (sexually transmitted infections). What's next?  Go to your health care provider once a year for a well check visit.  Ask your health care provider how often you should have your eyes and teeth checked.  Stay up to date on all vaccines. This information is not intended to replace advice given to you by your health care provider. Make sure you discuss any questions you have with your health care provider. Document Released: 08/19/2001 Document Revised: 06/17/2018 Document Reviewed: 06/17/2018 Elsevier Patient Education  2020 [employer].

## 2019-06-06 ENCOUNTER — Other Ambulatory Visit: Payer: Self-pay

## 2019-06-06 ENCOUNTER — Encounter: Payer: Self-pay | Admitting: Physician Assistant

## 2019-06-06 ENCOUNTER — Ambulatory Visit (INDEPENDENT_AMBULATORY_CARE_PROVIDER_SITE_OTHER): Payer: 59 | Admitting: Physician Assistant

## 2019-06-06 DIAGNOSIS — F419 Anxiety disorder, unspecified: Secondary | ICD-10-CM | POA: Diagnosis not present

## 2019-06-06 NOTE — Progress Notes (Signed)
   Virtual Visit via Video   I connected with patient on 06/06/19 at  8:00 AM EST by a video enabled telemedicine application and verified that I am speaking with the correct person using two identifiers.  Location patient: Home Location provider: [facility] Persons participating in the virtual visit: Patient, Provider, [place] (Patina Moore)  I discussed the limitations of evaluation and management by telemedicine and the availability of in person appointments. The patient expressed understanding and agreed to proceed.  Subjective:   HPI:   Patient presents via Doxy.Me for follow-up of anxiety. Patient started Lexapro 10 mg, is taking nightly, tolerating well. Endorses sleeping well, normal appetite. States his irritability has improved, feeling much less overwhelmed. Denies panic attacks, chest pain or tightness. Denies depressed mood or anhedonia. No noted side effects.    ROS:   See pertinent positives and negatives per HPI.  Patient Active Problem List   Diagnosis Date Noted  . Family history of early CAD 01/27/2018  . Gastroesophageal reflux disease without esophagitis 01/27/2018  . Visit for preventive health examination 05/28/2015    Social History   Tobacco Use  . Smoking status: Never Smoker  . Smokeless tobacco: Never Used  Substance Use Topics  . Alcohol use: No    Alcohol/week: 0.0 standard drinks    Current Outpatient Medications:  .  escitalopram (LEXAPRO) 10 MG tablet, Take 1 tablet (10 mg total) by mouth daily., Disp: 30 tablet, Rfl: 3 .  Multiple Vitamins-Minerals (MENS MULTIVITAMIN PLUS PO), Take by mouth daily., Disp: , Rfl:   Allergies  Allergen Reactions  . Amoxicillin Hives    Objective:   There were no vitals taken for this visit.  Patient is well-developed, well-nourished in no acute distress.  Resting comfortably at home.  Head is normocephalic, atraumatic.  No labored breathing.  Speech is clear and coherent with logical  content.  Patient is alert and oriented at baseline.   Assessment and Plan:   1. Anxiety Doing very well on current regimen. Continue same. Will follow-up in 3 months. Sooner if needed.     Leeanne Rio, PA-C 06/06/2019

## 2019-09-26 ENCOUNTER — Encounter: Payer: Self-pay | Admitting: Physician Assistant

## 2019-09-26 ENCOUNTER — Other Ambulatory Visit: Payer: Self-pay

## 2019-09-26 ENCOUNTER — Telehealth (INDEPENDENT_AMBULATORY_CARE_PROVIDER_SITE_OTHER): Payer: 59 | Admitting: Physician Assistant

## 2019-09-26 DIAGNOSIS — F411 Generalized anxiety disorder: Secondary | ICD-10-CM | POA: Diagnosis not present

## 2019-09-26 MED ORDER — ESCITALOPRAM OXALATE 10 MG PO TABS: 10.0000 mg | ORAL_TABLET | Freq: Every day | ORAL | 1 refills | Status: DC

## 2019-09-26 NOTE — Progress Notes (Signed)
   Virtual Visit via Video   I connected with patient on 09/26/19 at  9:00 AM EDT by a video enabled telemedicine application and verified that I am speaking with the correct person using two identifiers.  Location patient: Home Location provider: [facility], Office Persons participating in the virtual visit: Patient, Provider, CMA (Patina Moore)  I discussed the limitations of evaluation and management by telemedicine and the availability of in person appointments. The patient expressed understanding and agreed to proceed.  Subjective:   HPI:   Patient presents via [employer] today to follow-up regarding anxiety. Is currently on a regimen of Lexapro 10 mg daily. Endorses taking medications as directed. Notes tolerating well overall. Notes no side effect with medication. Is noting great improvement in symptoms. Sleeping well at night. Denies any depressed mood or anhedonia. Denies SI/HI.  ROS:   See pertinent positives and negatives per HPI.  Patient Active Problem List   Diagnosis Date Noted  . Family history of early CAD 01/27/2018  . Gastroesophageal reflux disease without esophagitis 01/27/2018  . Visit for preventive health examination 05/28/2015    Social History   Tobacco Use  . Smoking status: Never Smoker  . Smokeless tobacco: Never Used  Substance Use Topics  . Alcohol use: No    Alcohol/week: 0.0 standard drinks    Current Outpatient Medications:  .  escitalopram (LEXAPRO) 10 MG tablet, Take 1 tablet (10 mg total) by mouth daily., Disp: 30 tablet, Rfl: 3 .  Multiple Vitamins-Minerals (MENS MULTIVITAMIN PLUS PO), Take by mouth daily., Disp: , Rfl:   Allergies  Allergen Reactions  . Amoxicillin Hives    Objective:   There were no vitals taken for this visit.  Patient is well-developed, well-nourished in no acute distress.  Resting comfortably at home.  Head is normocephalic, atraumatic.  No labored breathing.  Speech is clear and coherent with  logical content.  Patient is alert and oriented at baseline.   Assessment and Plan:   1. GAD (generalized anxiety disorder) Doing very well. Continue current regimen. Refills sent to pharmacy. Follow-up in 6 months.     Piedad Climes, [place] 09/26/2019

## 2019-09-26 NOTE — Progress Notes (Signed)
I have discussed the procedure for the virtual visit with the patient who has given consent to proceed with assessment and treatment.   Patient is unable to get vitals at the time of visit.  Con Memos, CMA

## 2020-02-09 ENCOUNTER — Ambulatory Visit (INDEPENDENT_AMBULATORY_CARE_PROVIDER_SITE_OTHER): Payer: 59 | Admitting: Physician Assistant

## 2020-02-09 ENCOUNTER — Other Ambulatory Visit: Payer: Self-pay

## 2020-02-09 ENCOUNTER — Encounter: Payer: Self-pay | Admitting: Physician Assistant

## 2020-02-09 VITALS — BP 120/80 | HR 76 | Temp 98.5°F | Resp 16 | Ht 69.0 in | Wt 179.0 lb

## 2020-02-09 DIAGNOSIS — H6983 Other specified disorders of Eustachian tube, bilateral: Secondary | ICD-10-CM | POA: Diagnosis not present

## 2020-02-09 MED ORDER — LEVOCETIRIZINE DIHYDROCHLORIDE 5 MG PO TABS: 5.0000 mg | ORAL_TABLET | Freq: Every evening | ORAL | 1 refills | Status: DC

## 2020-02-09 MED ORDER — FLUTICASONE PROPIONATE 50 MCG/ACT NA SUSP: 2.0000 | Freq: Every day | NASAL | 1 refills | Status: DC

## 2020-02-09 NOTE — Patient Instructions (Signed)
Please keep well-hydrated. Start the Xyzal daily ( I did send in as a script in hopes it will be cheaper). Use the Flonase once daily as directed.  Let me know if symptoms are not resolving over the next week.  Hang in there!

## 2020-02-09 NOTE — Progress Notes (Signed)
Patient presents to clinic today c/o 4 days of bilateral ear pressure, popping and pain.  Notes pain has been pretty constant but is improved since last night.  Notes some nasal congestion and postnasal drip.  Denies fever, chills, malaise or fatigue.  Denies chest congestion or cough.  Denies sinus pressure or pain.  Denies any recent travel or swimming.   Past Medical History:  Diagnosis Date  . Anxiety   . History of chicken pox    childhood  . History of kidney stones 2006    Current Outpatient Medications on File Prior to Visit  Medication Sig Dispense Refill  . escitalopram (LEXAPRO) 10 MG tablet Take 1 tablet (10 mg total) by mouth daily. 90 tablet 1  . Multiple Vitamins-Minerals (MENS MULTIVITAMIN PLUS PO) Take by mouth daily.     No current facility-administered medications on file prior to visit.    Allergies  Allergen Reactions  . Amoxicillin Hives    Family History  Problem Relation Age of Onset  . Hyperlipidemia Mother        Father,  MGM  . Heart disease Father 23       cad-angioplasty  . Hypertension Father        MGM  . Colon cancer Paternal Grandfather 83  . Prostate cancer Maternal Grandfather   . Coronary artery disease Maternal Grandfather   . Diabetes Paternal Grandmother     Social History   Socioeconomic History  . Marital status: Married    Spouse name: Not on file  . Number of children: Not on file  . Years of education: Not on file  . Highest education level: Not on file  Occupational History  . Occupation: IT trainer  Tobacco Use  . Smoking status: Never Smoker  . Smokeless tobacco: Never Used  Vaping Use  . Vaping Use: Never used  Substance and Sexual Activity  . Alcohol use: No    Alcohol/week: 0.0 standard drinks  . Drug use: No  . Sexual activity: Yes    Partners: Female  Other Topics Concern  . Not on file  Social History Narrative  . Not on file   Social Determinants of Health   Financial Resource Strain:   . Difficulty  of Paying Living Expenses:   Food Insecurity:   . Worried About Programme researcher, broadcasting/film/video in the Last Year:   . Barista in the Last Year:   Transportation Needs:   . Freight forwarder (Medical):   Marland Kitchen Lack of Transportation (Non-Medical):   Physical Activity:   . Days of Exercise per Week:   . Minutes of Exercise per Session:   Stress:   . Feeling of Stress :   Social Connections:   . Frequency of Communication with Friends and Family:   . Frequency of Social Gatherings with Friends and Family:   . Attends Religious Services:   . Active Member of Clubs or Organizations:   . Attends Banker Meetings:   Marland Kitchen Marital Status:    Review of Systems - See HPI.  All other ROS are negative.  BP 120/80   Pulse 76   Temp 98.5 F (36.9 C) (Temporal)   Resp 16   Ht 5\' 9"  (1.753 m)   Wt 179 lb (81.2 kg)   SpO2 98%   BMI 26.43 kg/m   Physical Exam Vitals reviewed.  Constitutional:      Appearance: Normal appearance.  HENT:     Head: Normocephalic and atraumatic.  Right Ear: Ear canal and external ear normal. A middle ear effusion (serous) is present. There is no impacted cerumen.     Left Ear: Ear canal and external ear normal. A middle ear effusion (serous) is present. There is no impacted cerumen.  Eyes:     Conjunctiva/sclera: Conjunctivae normal.  Cardiovascular:     Rate and Rhythm: Normal rate and regular rhythm.     Pulses: Normal pulses.     Heart sounds: Normal heart sounds.  Pulmonary:     Effort: Pulmonary effort is normal.     Breath sounds: Normal breath sounds.  Musculoskeletal:     Cervical back: Neck supple.  Neurological:     Mental Status: He is alert.  Psychiatric:        Mood and Affect: Mood normal.    Assessment/Plan: 1. Eustachian tube dysfunction, bilateral No sign of acute otitis media.  Eustachian tube dysfunction as cause of symptoms.  Start Xyzal once daily.  Flonase each morning.  Supportive measures discussed.  Follow-up if  not improving/resolving or if new symptoms develop.  Patient voiced understanding and agreement with the plan. - levocetirizine (XYZAL) 5 MG tablet; Take 1 tablet (5 mg total) by mouth every evening.  Dispense: 30 tablet; Refill: 1 - fluticasone (FLONASE) 50 MCG/ACT nasal spray; Place 2 sprays into both nostrils daily.  Dispense: 16 g; Refill: 1  This visit occurred during the SARS-CoV-2 public health emergency.  Safety protocols were in place, including screening questions prior to the visit, additional usage of staff PPE, and extensive cleaning of exam room while observing appropriate contact time as indicated for disinfecting solutions.     Piedad Climes, PA-C

## 2020-02-29 ENCOUNTER — Other Ambulatory Visit: Payer: Self-pay | Admitting: Emergency Medicine

## 2020-02-29 DIAGNOSIS — F411 Generalized anxiety disorder: Secondary | ICD-10-CM

## 2020-02-29 MED ORDER — ESCITALOPRAM OXALATE 10 MG PO TABS: 10.0000 mg | ORAL_TABLET | Freq: Every day | ORAL | 0 refills | Status: DC

## 2020-04-10 ENCOUNTER — Telehealth: Payer: Self-pay | Admitting: Physician Assistant

## 2020-04-10 NOTE — Telephone Encounter (Signed)
Spoke with pt to have him first check pharmacy. Pt received 90 day supply in August 25th.

## 2020-04-10 NOTE — Telephone Encounter (Signed)
..  Medication Refills  Medication:  Escitalopram 10 mg   Pharmacy:  [facility] - [place] - [address]  ** Let patient know to contact pharmacy at the end of the day to make sure medication is ready.**  ** Please notify patient to allow 48-72 hours to process.**  ** Encourage patient to contact the pharmacy for refills or they can request refills through [facility]**  Clinical Fills out below:   Last refill:  QTY:  Refill Date:    Other Comments:   Okay for refill?  Please advise.

## 2020-08-17 ENCOUNTER — Encounter: Payer: Self-pay | Admitting: Physician Assistant

## 2020-08-17 ENCOUNTER — Ambulatory Visit (INDEPENDENT_AMBULATORY_CARE_PROVIDER_SITE_OTHER): Payer: 59 | Admitting: Physician Assistant

## 2020-08-17 ENCOUNTER — Other Ambulatory Visit: Payer: Self-pay

## 2020-08-17 VITALS — BP 130/82 | HR 81 | Temp 98.2°F | Resp 16 | Ht 69.0 in | Wt 179.0 lb

## 2020-08-17 DIAGNOSIS — J302 Other seasonal allergic rhinitis: Secondary | ICD-10-CM

## 2020-08-17 DIAGNOSIS — E785 Hyperlipidemia, unspecified: Secondary | ICD-10-CM

## 2020-08-17 DIAGNOSIS — Z3009 Encounter for other general counseling and advice on contraception: Secondary | ICD-10-CM | POA: Diagnosis not present

## 2020-08-17 DIAGNOSIS — F411 Generalized anxiety disorder: Secondary | ICD-10-CM

## 2020-08-17 MED ORDER — ESCITALOPRAM OXALATE 10 MG PO TABS: 10.0000 mg | ORAL_TABLET | Freq: Every day | ORAL | 0 refills | Status: DC

## 2020-08-17 NOTE — Progress Notes (Signed)
New Patient Office Visit  Subjective:  Patient ID: Alejandro Weber, male    DOB: 03-10-1986  Age: 35 y.o. MRN: [id number]  CC:  Chief Complaint  Patient presents with  . Transitions Of Care    HPI Alejandro Weber presents for a transfer of care visit from [facility], [place].  He is a 35 year old married male with a 4-year-old, 24-year-old, and 2-1/92-month old at home.  He works in Audiological scientist.  Medical history includes anxiety disorder and seasonal allergies.  He also has a family history of cholesterol issues and it looks like his past few lipid panels have been elevated as well.  He tries to stay as active as he can, but does state it is hard working and balancing with the kids.  He does not have any acute concerns today.  He does need a refill on his Lexapro.  Past Medical History:  Diagnosis Date  . Anxiety   . History of chicken pox    childhood  . History of kidney stones 2006    Past Surgical History:  Procedure Laterality Date  . NO PAST SURGERIES  12/22/2011    Family History  Problem Relation Age of Onset  . Hyperlipidemia Mother        Father,  MGM  . Heart disease Father 28       cad-angioplasty  . Hypertension Father        MGM  . Colon cancer Paternal Grandfather 60  . Prostate cancer Maternal Grandfather   . Coronary artery disease Maternal Grandfather   . Diabetes Paternal Grandmother     Social History   Socioeconomic History  . Marital status: Married    Spouse name: Not on file  . Number of children: Not on file  . Years of education: Not on file  . Highest education level: Not on file  Occupational History  . Occupation: IT trainer  Tobacco Use  . Smoking status: Never Smoker  . Smokeless tobacco: Never Used  Vaping Use  . Vaping Use: Never used  Substance and Sexual Activity  . Alcohol use: No    Alcohol/week: 0.0 standard drinks  . Drug use: No  . Sexual activity: Yes    Partners: Female  Other Topics Concern  . Not on file  Social History  Narrative  . Not on file   Social Determinants of Health   Financial Resource Strain: Not on file  Food Insecurity: Not on file  Transportation Needs: Not on file  Physical Activity: Not on file  Stress: Not on file  Social Connections: Not on file  Intimate Partner Violence: Not on file    ROS Review of Systems  Constitutional: Negative for activity change, appetite change and unexpected weight change.  HENT: Negative for congestion.   Eyes: Negative for visual disturbance.  Respiratory: Negative for apnea and shortness of breath.   Cardiovascular: Negative for chest pain.  Gastrointestinal: Negative for abdominal pain and blood in stool.  Endocrine: Negative for polydipsia, polyphagia and polyuria.  Genitourinary: Negative for difficulty urinating.  Musculoskeletal: Negative for arthralgias.  Skin: Negative for rash.  Neurological: Negative for seizures, weakness and headaches.  Psychiatric/Behavioral: Negative for sleep disturbance and suicidal ideas. The patient is nervous/anxious (stable).     Objective:   Today's Vitals: BP 130/82   Pulse 81   Temp 98.2 F (36.8 C) (Temporal)   Resp 16   Ht 5\' 9"  (1.753 m)   Wt 179 lb (81.2 kg)   SpO2 98%  BMI 26.43 kg/m   Physical Exam Vitals and nursing note reviewed.  Constitutional:      General: He is not in acute distress.    Appearance: Normal appearance. He is not toxic-appearing.  HENT:     Head: Normocephalic and atraumatic.     Right Ear: Tympanic membrane, ear canal and external ear normal.     Left Ear: Tympanic membrane, ear canal and external ear normal.     Nose: Nose normal.     Mouth/Throat:     Mouth: Mucous membranes are moist.     Pharynx: Oropharynx is clear.  Eyes:     Extraocular Movements: Extraocular movements intact.     Conjunctiva/sclera: Conjunctivae normal.     Pupils: Pupils are equal, round, and reactive to light.  Cardiovascular:     Rate and Rhythm: Normal rate and regular rhythm.      Pulses: Normal pulses.     Heart sounds: Normal heart sounds.  Pulmonary:     Effort: Pulmonary effort is normal.     Breath sounds: Normal breath sounds.  Abdominal:     General: Abdomen is flat. Bowel sounds are normal.     Palpations: Abdomen is soft.     Tenderness: There is no abdominal tenderness.  Musculoskeletal:        General: Normal range of motion.     Cervical back: Normal range of motion and neck supple.  Skin:    General: Skin is warm and dry.  Neurological:     General: No focal deficit present.     Mental Status: He is alert and oriented to person, place, and time.  Psychiatric:        Mood and Affect: Mood normal.        Behavior: Behavior normal.     Assessment & Plan:   Problem List Items Addressed This Visit      Other   GAD (generalized anxiety disorder) - Primary   Relevant Medications   escitalopram (LEXAPRO) 10 MG tablet    Other Visit Diagnoses    Seasonal allergies       Dyslipidemia, goal to be determined          Outpatient Encounter Medications as of 08/17/2020  Medication Sig  . escitalopram (LEXAPRO) 10 MG tablet Take 1 tablet (10 mg total) by mouth daily.  Marland Kitchen escitalopram (LEXAPRO) 10 MG tablet Take 1 tablet (10 mg total) by mouth daily.  . fluticasone (FLONASE) 50 MCG/ACT nasal spray Place 2 sprays into both nostrils daily.  Marland Kitchen levocetirizine (XYZAL) 5 MG tablet Take 1 tablet (5 mg total) by mouth every evening.  . Multiple Vitamins-Minerals (MENS MULTIVITAMIN PLUS PO) Take by mouth daily.   No facility-administered encounter medications on file as of 08/17/2020.   1. GAD (generalized anxiety disorder) He has been stable on Lexapro 10 mg daily.  He started taking this around the year 2016 when his first child was born.  He also experienced the loss of his father and that time.  He has come off of the Lexapro in the past, but feels like he does better taking the medication.  This was refilled for him today.  He denies any side effects  from the medication.  2. Seasonal allergies He takes Xyzal and Flonase as needed.  Does not have any concerns about this today.  3. Dyslipidemia, goal to be determined We will check a lipid panel at his next physical this year.  He was advised to work on healthy  eating and exercise.  Walking daily encouraged.  Low-sodium and low-fat diet encouraged.  4. Desire for Vasectomy I will send a referral to urology per patient request today.    Follow-up: Return in about 6 months (around 02/14/2021) for CPE with labs same day .   Jkayla Spiewak M Allaya Abbasi, PA-C

## 2020-08-17 NOTE — Patient Instructions (Signed)
Pleasure meeting you today! See you back in 6 months for CPE and labs. Call sooner if any concerns.   Preventing High Cholesterol Cholesterol is a white, waxy substance similar to fat that the human body needs to help build cells. The liver makes all the cholesterol that a person's body needs. Having high cholesterol (hypercholesterolemia) increases your risk for heart disease and stroke. Extra or excess cholesterol comes from the food that you eat. High cholesterol can often be prevented with diet and lifestyle changes. If you already have high cholesterol, you can control it with diet, lifestyle changes, and medicines. How can high cholesterol affect me? If you have high cholesterol, fatty deposits (plaques) may build up on the walls of your blood vessels. The blood vessels that carry blood away from your heart are called arteries. Plaques make the arteries narrower and stiffer. This in turn can:  Restrict or block blood flow and cause blood clots to form.  Increase your risk for heart attack and stroke. What can increase my risk for high cholesterol? This condition is more likely to develop in people who:  Eat foods that are high in saturated fat or cholesterol. Saturated fat is mostly found in foods that come from animal sources.  Are overweight.  Are not getting enough exercise.  Have a family history of high cholesterol (familial hypercholesterolemia). What actions can I take to prevent this? Nutrition  Eat less saturated fat.  Avoid trans fats (partially hydrogenated oils). These are often found in margarine and in some baked goods, fried foods, and snacks bought in packages.  Avoid precooked or cured meat, such as bacon, sausages, or meat loaves.  Avoid foods and drinks that have added sugars.  Eat more fruits, vegetables, and whole grains.  Choose healthy sources of protein, such as fish, poultry, lean cuts of red meat, beans, peas, lentils, and nuts.  Choose healthy  sources of fat, such as: ? Nuts. ? Vegetable oils, especially olive oil. ? Fish that have healthy fats, such as omega-3 fatty acids. These fish include mackerel or salmon.   Lifestyle  Lose weight if you are overweight. Maintaining a healthy body mass index (BMI) can help prevent or control high cholesterol. It can also lower your risk for diabetes and high blood pressure. Ask your health care provider to help you with a diet and exercise plan to lose weight safely.  Do not use any products that contain nicotine or tobacco, such as cigarettes, e-cigarettes, and chewing tobacco. If you need help quitting, ask your health care provider. Alcohol use  Do not drink alcohol if: ? Your health care provider tells you not to drink. ? You are pregnant, may be pregnant, or are planning to become pregnant.  If you drink alcohol: ? Limit how much you use to:  0-1 drink a day for women.  0-2 drinks a day for men. ? Be aware of how much alcohol is in your drink. In the [place] one drink equals one 12 oz bottle of beer (355 mL), one 5 oz glass of wine (148 mL), or one 1 oz glass of hard liquor (44 mL). Activity  Get enough exercise. Do exercises as told by your health care provider.  Each week, do at least 150 minutes of exercise that takes a medium level of effort (moderate-intensity exercise). This kind of exercise: ? Makes your heart beat faster while allowing you to still be able to talk. ? Can be done in short sessions several times a day  or longer sessions a few times a week. For example, on 5 days each week, you could walk fast or ride your bike 3 times a day for 10 minutes each time.   Medicines  Your health care provider may recommend medicines to help lower cholesterol. This may be a medicine to lower the amount of cholesterol that your liver makes. You may need medicine if: ? Diet and lifestyle changes have not lowered your cholesterol enough. ? You have high cholesterol and other risk  factors for heart disease or stroke.  Take over-the-counter and prescription medicines only as told by your health care provider. General information  Manage your risk factors for high cholesterol. Talk with your health care provider about all your risk factors and how to lower your risk.  Manage other conditions that you have, such as diabetes or high blood pressure (hypertension).  Have blood tests to check your cholesterol levels at regular points in time as told by your health care provider.  Keep all follow-up visits as told by your health care provider. This is important. Where to find more information  American Heart Association: www.heart.org  National Heart, Lung, and Blood Institute: Pop[employer].is Summary  High cholesterol increases your risk for heart disease and stroke. By keeping your cholesterol level low, you can reduce your risk for these conditions.  High cholesterol can often be prevented with diet and lifestyle changes.  Work with your health care provider to manage your risk factors, and have your blood tested regularly. This information is not intended to replace advice given to you by your health care provider. Make sure you discuss any questions you have with your health care provider. Document Revised: 04/05/2019 Document Reviewed: 04/05/2019 Elsevier Patient Education  2021 [employer].

## 2020-09-26 ENCOUNTER — Encounter: Payer: Self-pay | Admitting: Urology

## 2020-09-26 ENCOUNTER — Ambulatory Visit (INDEPENDENT_AMBULATORY_CARE_PROVIDER_SITE_OTHER): Payer: 59 | Admitting: Urology

## 2020-09-26 ENCOUNTER — Other Ambulatory Visit: Payer: Self-pay

## 2020-09-26 VITALS — BP 128/86 | HR 77 | Temp 98.9°F | Ht 69.0 in | Wt 180.0 lb

## 2020-09-26 DIAGNOSIS — Z3009 Encounter for other general counseling and advice on contraception: Secondary | ICD-10-CM

## 2020-09-26 MED ORDER — DIAZEPAM 10 MG PO TABS: 10.0000 mg | ORAL_TABLET | Freq: Once | ORAL | 0 refills | Status: AC

## 2020-09-26 NOTE — Progress Notes (Signed)
Urological Symptom Review  Patient is experiencing the following symptoms: none   Review of Systems  Gastrointestinal (upper)  : Negative for upper GI symptoms  Gastrointestinal (lower) : Negative for lower GI symptoms  Constitutional : Negative for symptoms  Skin: Negative for skin symptoms  Eyes: Negative for eye symptoms  Ear/Nose/Throat : Negative for Ear/Nose/Throat symptoms  Hematologic/Lymphatic: Negative for Hematologic/Lymphatic symptoms  Cardiovascular : Negative for cardiovascular symptoms  Respiratory : Negative for respiratory symptoms  Endocrine: Negative for endocrine symptoms  Musculoskeletal: Negative for musculoskeletal symptoms  Neurological: Negative for neurological symptoms  Psychologic: Anxiety  

## 2020-09-26 NOTE — Progress Notes (Signed)
09/26/2020 3:51 PM   [name] 1985-10-10 [id number]  Referring provider: Allwardt, Crist Infante, PA-C [address] [place],  [place] [address]  Desires sterilization  HPI: Mr Alejandro Weber is a 35yo here to discuss vasectomy. He has 36 healthy children. No prior scrotal surgeries. No hx of UTI, testis infections. No hx of scrotal trauma. No erectile dysfunction   PMH: Past Medical History:  Diagnosis Date  . Anxiety   . History of chicken pox    childhood  . History of kidney stones 2006    Surgical History: Past Surgical History:  Procedure Laterality Date  . NO PAST SURGERIES  12/22/2011    Home Medications:  Allergies as of 09/26/2020      Reactions   Amoxicillin Hives      Medication List       Accurate as of September 26, 2020  3:51 PM. If you have any questions, ask your nurse or doctor.        escitalopram 10 MG tablet Commonly known as: Lexapro Take 1 tablet (10 mg total) by mouth daily.   escitalopram 10 MG tablet Commonly known as: Lexapro Take 1 tablet (10 mg total) by mouth daily.   fluticasone 50 MCG/ACT nasal spray Commonly known as: FLONASE Place 2 sprays into both nostrils daily.   levocetirizine 5 MG tablet Commonly known as: Xyzal Take 1 tablet (5 mg total) by mouth every evening.   MENS MULTIVITAMIN PLUS PO Take by mouth daily.       Allergies:  Allergies  Allergen Reactions  . Amoxicillin Hives    Family History: Family History  Problem Relation Age of Onset  . Hyperlipidemia Mother        Father,  MGM  . Heart disease Father 36       cad-angioplasty  . Hypertension Father        MGM  . Esophageal cancer Father   . Colon cancer Paternal Grandfather 15  . Prostate cancer Maternal Grandfather   . Coronary artery disease Maternal Grandfather   . Diabetes Paternal Grandmother     Social History:  reports that he has never smoked. He has never used smokeless tobacco. He reports that he does not drink alcohol and does not  use drugs.  ROS: All other review of systems were reviewed and are negative except what is noted above in HPI  Physical Exam: BP 128/86   Pulse 77   Temp 98.9 F (37.2 C)   Ht 5\' 9"  (1.753 m)   Wt 180 lb (81.6 kg)   BMI 26.58 kg/m   Constitutional:  Alert and oriented, No acute distress. HEENT: [place] AT, moist mucus membranes.  Trachea midline, no masses. Cardiovascular: No clubbing, cyanosis, or edema. Respiratory: Normal respiratory effort, no increased work of breathing. GI: Abdomen is soft, nontender, nondistended, no abdominal masses GU: No CVA tenderness. Circumcised phallus. No masses/lesions on penis, testis, scrotum. Vas deferens palpable bilaterally. Lymph: No cervical or inguinal lymphadenopathy. Skin: No rashes, bruises or suspicious lesions. Neurologic: Grossly intact, no focal deficits, moving all 4 extremities. Psychiatric: Normal mood and affect.  Laboratory Data: Lab Results  Component Value Date   WBC 7.2 05/02/2019   HGB 15.2 05/02/2019   HCT 43.9 05/02/2019   MCV 89.0 05/02/2019   PLT 287.0 05/02/2019    Lab Results  Component Value Date   CREATININE 0.99 05/02/2019    No results found for: PSA  No results found for: TESTOSTERONE  Lab Results  Component Value Date  HGBA1C 5.2 05/02/2019    Urinalysis    Component Value Date/Time   COLORURINE YELLOW 06/20/2016 0849   APPEARANCEUR CLEAR 06/20/2016 0849   LABSPEC 1.015 06/20/2016 0849   PHURINE 7.0 06/20/2016 0849   GLUCOSEU NEGATIVE 06/20/2016 0849   HGBUR NEGATIVE 06/20/2016 0849   BILIRUBINUR NEGATIVE 06/20/2016 0849   KETONESUR NEGATIVE 06/20/2016 0849   PROTEINUR NEG 12/22/2011 1408   UROBILINOGEN 0.2 06/20/2016 0849   NITRITE NEGATIVE 06/20/2016 0849   LEUKOCYTESUR NEGATIVE 06/20/2016 0849    No results found for: LABMICR, WBCUA, RBCUA, LABEPIT, MUCUS, BACTERIA  Pertinent Imaging:  No results found for this or any previous visit.  No results found for this or any previous  visit.  No results found for this or any previous visit.  No results found for this or any previous visit.  No results found for this or any previous visit.  No results found for this or any previous visit.  No results found for this or any previous visit.  No results found for this or any previous visit.   Assessment & Plan:    1. Vasectomy evaluation -Schedule for vasectomy. Risks/benefits alternatives discussed    No follow-ups on file.  Wilkie Aye, MD  [facility] [place]

## 2020-09-26 NOTE — Patient Instructions (Signed)
Vasectomy Vasectomy is a procedure in which the vas deferens is cut and then tied or burned (cauterized). The vas deferens is a tube that carries sperm from the testicle to the part of the body that drains urine from the bladder (urethra). This procedure blocks sperm from going through the vas deferens and penis during ejaculation. This ensures that sperm does not go into the vagina during sex. Vasectomy does not affect sexual desire or performance and does not prevent sexually transmitted infections. Vasectomy is considered a permanent and very effective form of birth control (contraception). The decision to have a vasectomy should not be made during a stressful time, such as after the loss of a pregnancy or a divorce. You and your partner should decide on whether to have a vasectomy when you are sure that you do not want children in the future. Tell a health care provider about:  Any allergies you have.  All medicines you are taking, including vitamins, herbs, eye drops, creams, and over-the-counter medicines.  Any problems you or family members have had with anesthetic medicines.  Any blood disorders you have.  Any surgeries you have had.  Any medical conditions you have. What are the risks? Generally, this is a safe procedure. However, problems may occur, including:  Infection.  Bleeding and swelling of the scrotum. The scrotum is the sac that contains the testicles, blood vessels, and structures that help deliver sperm and semen.  Allergic reactions to medicines.  Failure of the procedure to prevent pregnancy. There is a very small chance that the tied or cauterized ends of the vas deferens may reconnect (recanalization). If this happens, you could still make a woman pregnant.  Pain in the scrotum that continues after you heal from the procedure. What happens before the procedure? Medicines  Ask your health care provider about: ? Changing or stopping your regular medicines. This  is especially important if you are taking diabetes medicines or blood thinners. ? Taking medicines such as aspirin and ibuprofen. These medicines can thin your blood. Do not take these medicines unless your health care provider tells you to take them. ? Taking over-the-counter medicines, vitamins, herbs, and supplements.  You may be told to take a medicine to help you relax (sedative) a few hours before the procedure. General instructions  Do not use any products that contain nicotine or tobacco for at least 4 weeks before the procedure. These products include cigarettes, e-cigarettes, and chewing tobacco. If you need help quitting, ask your health care provider.  Plan to have a responsible adult take you home from the hospital or clinic.  If you will be going home right after the procedure, plan to have a responsible adult care for you for the time you are told. This is important.  Ask your health care provider: ? How your surgery site will be marked. ? What steps will be taken to help prevent infection. These steps may include:  Removing hair at the surgery site.  Washing skin with a germ-killing soap.  Taking antibiotic medicine. What happens during the procedure?  You will be given one or more of the following: ? A sedative, unless you were told to take this a few hours before the procedure. ? A medicine to numb the area (local anesthetic).  Your health care provider will feel, or palpate, for your vas deferens.  To reach the vas deferens, one of two methods may be used: ? A very small incision may be made in your scrotum. ?  A punctured opening may be made in your scrotum, without an incision.  Your vas deferens will be pulled out of your scrotum and cut. Then, the vas deferens will be closed in one of two ways: ? Tied at the ends. ? Cauterized at the ends to seal them off.  The vas deferens will be put back into your scrotum.  The incision or puncture opening will be  closed with absorbable stitches (sutures). The sutures will eventually dissolve and will not need to be removed after the procedure.  The procedure will be repeated on the other side of your scrotum. The procedure may vary among health care providers and hospitals.   What happens after the procedure?  You will be monitored to make sure that you do not have problems.  You will be asked not to ejaculate for at least 1 week after the procedure, or for as long as you are told.  You will need to use a different form of contraception for 2-4 months after the procedure, until you have test results confirming that there are no sperm in your semen.  You may be given scrotal support to wear, such as a jockstrap or underwear with a supportive pouch.  If you were given a sedative during the procedure, it can affect you for several hours. Do not drive or operate machinery until your health care provider says that it is safe. Summary  Vasectomy blocks sperm from being released during ejaculation. This procedure is considered a permanent and very effective form of birth control.  Your scrotum will be numbed with medicine (local anesthetic) for the procedure.  After the procedure, you will be asked not to ejaculate for at least 1 week, or for as long as you are told. You will also need to use a different form of contraception until your test results confirm that there are no sperm in your semen. This information is not intended to replace advice given to you by your health care provider. Make sure you discuss any questions you have with your health care provider. Document Revised: 11/10/2019 Document Reviewed: 11/10/2019 Elsevier Patient Education  2021 [employer].

## 2020-10-23 ENCOUNTER — Other Ambulatory Visit: Payer: Self-pay | Admitting: Physician Assistant

## 2020-11-04 HISTORY — PX: VASECTOMY: SHX75

## 2020-11-12 ENCOUNTER — Ambulatory Visit (INDEPENDENT_AMBULATORY_CARE_PROVIDER_SITE_OTHER): Payer: 59 | Admitting: Urology

## 2020-11-12 ENCOUNTER — Other Ambulatory Visit: Payer: Self-pay

## 2020-11-12 VITALS — BP 136/90 | HR 90

## 2020-11-12 DIAGNOSIS — Z3009 Encounter for other general counseling and advice on contraception: Secondary | ICD-10-CM

## 2020-11-12 NOTE — Patient Instructions (Signed)
Vasectomy Postoperative Instructions  Please bring back a semen analysis in approximately 3 months.  Your semen analysis  will need to be taken to  [facility] [address] [place], [place] [address] There phone number is [phone]. Please call and schedule an appointment before taking your specimen.    You will be given a sterile specimen cup. Please label the cup with your name, date of birth, date and time of collections.  What to Expect  - slight redness, swelling and scant drainage along the incision  - mild to moderate discomfort  - black and blue (bruising) as the tissue heals  - low grade fever  - scrotal sensitivity and/or tenderness - Edges of the incision may pull apart and heal slowly, sometimes a knot may be present which remains for several months.  This is NORMAL and all part of the healing process. - if stitches are placed, they do not need to be removed - if you have pain or discomfort immediately after the vasectomy, you may use OTC pain medication for relief , ex: tylenol.  After local anesthetic wears off an ice pack will provide additional comfort and can also prevent swelling if used  Activity  - no sexual intercourse for at lease 5 days depending on comfort  - no heavy lifting for 48-72 hours (anything over 5-10 lbs)  Wound Care  - shower only after 24 hours  - no tub baths, hot tub, or pools for at least 7 days  - ice packs for 48 hours: 30 minutes on and 30 minutes off  Problem to Report  - generalized redness  - increased pain and swelling  - fever greater than 101 F  - significant drainage or bleeding from the wound  TO DO - Ejaculations help to clear the passage of sperm, but you must use another from of birth control until you are told you may discontinue its use!! - You will be given a specimen cup to bring back a semen sample in 3 months to check and see if its clear of sperm.  Only after the semen is sent for analysis and is reported back as clear  should you use this as your primary form of birth control!   

## 2020-11-20 ENCOUNTER — Encounter: Payer: Self-pay | Admitting: Urology

## 2020-11-20 NOTE — Progress Notes (Signed)
11/20/20  CC: No chief complaint on file.   HPI:  Blood pressure 136/90, pulse 90. NED. A&Ox3.   No respiratory distress   Abd soft, NT, ND Normal external genitalia with patent urethral meatus   Bilateral Vasectomy Procedure  Pre-Procedure: - Patient's scrotum was prepped and draped for vasectomy. - The vas was palpated through the scrotal skin on the left. - 1% Xylocaine was injected into the skin and surrounding tissue for placement  - In a similar manner, the vas on the right was identified, anesthetized, and stabilized.  Procedure: - A #11 blade was used to make a small stab incision in the skin overlying the vas - The left vas was isolated and brought up through the incision exposing that structure. - Bleeding points were cauterized as they occurred. - The vas was free from the surrounding structures and brought to the view. - A segment was positioned for placement with a hemostat. - A second hemostat was placed and a small segment between the two hemostats and was removed for inspection. - Each end of the transected vas lumen was fulgurated/ obliterated using needlepoint electrocautery -A fascial interposition was performed on testicular end of the vas using #3-0 chromic suture -The same procedure was performed on the right. - A single suture of #3-0 chromic catgut was used to close each lateral scrotal skin incision - A dressing was applied.  Post-Procedure: - Patient was instructed in care of the operative area - A specimen is to be delivered in 12 weeks   -Another form of contraception is to be used until post vasectomy semen analysis  Wilkie Aye, MD

## 2020-11-21 ENCOUNTER — Encounter: Payer: Self-pay | Admitting: Physician Assistant

## 2021-01-27 ENCOUNTER — Telehealth (HOSPITAL_COMMUNITY): Payer: Self-pay | Admitting: Emergency Medicine

## 2021-01-27 ENCOUNTER — Other Ambulatory Visit: Payer: Self-pay

## 2021-01-27 ENCOUNTER — Ambulatory Visit (HOSPITAL_COMMUNITY)
Admission: EM | Admit: 2021-01-27 | Discharge: 2021-01-27 | Disposition: A | Discharge status: Home / Self Care | Payer: 59 | Attending: Emergency Medicine | Admitting: Emergency Medicine

## 2021-01-27 ENCOUNTER — Encounter (HOSPITAL_COMMUNITY): Payer: Self-pay | Admitting: Emergency Medicine

## 2021-01-27 DIAGNOSIS — J029 Acute pharyngitis, unspecified: Secondary | ICD-10-CM

## 2021-01-27 LAB — POCT RAPID STREP A, ED / UC: Streptococcus, Group A Screen (Direct): NEGATIVE

## 2021-01-27 NOTE — Telephone Encounter (Signed)
Note created to add strep A culture.

## 2021-01-27 NOTE — ED Provider Notes (Signed)
[facility]    CSN: [id number] Arrival date & time: 01/27/21  1017      History   Chief Complaint Chief Complaint  Patient presents with   Sore Throat    HPI Alejandro Weber is a 35 y.o. male.   HPI Alejandro Weber is a 36 y.o. male presenting to UC with c/o 3 days of gradually worsening sore throat that is 3/10, worse with swallowing. Improves with ibuprofen, last dose last night.  Mild dry cough in the morning, slight chills at night without fever. Denies HA, dizziness, n/v/d. No body aches. No known sick contacts. He has received 2 COVID vaccines about 1 year ago.  Able to eat, drink and breath without difficulty.     Past Medical History:  Diagnosis Date   Anxiety    History of chicken pox    childhood   History of kidney stones 2006    Patient Active Problem List   Diagnosis Date Noted   GAD (generalized anxiety disorder) 09/26/2019   Family history of early CAD 01/27/2018   Gastroesophageal reflux disease without esophagitis 01/27/2018   Visit for preventive health examination 05/28/2015    Past Surgical History:  Procedure Laterality Date   NO PAST SURGERIES  12/22/2011       Home Medications    Prior to Admission medications   Medication Sig Start Date End Date Taking? Authorizing Provider  escitalopram (LEXAPRO) 10 MG tablet Take 1 tablet (10 mg total) by mouth daily. 02/29/20   Waldon Merl, PA-C  escitalopram (LEXAPRO) 10 MG tablet TAKE 1 TABLET BY MOUTH  DAILY 10/23/20   Allwardt, Alyssa M, PA-C  fluticasone (FLONASE) 50 MCG/ACT nasal spray Place 2 sprays into both nostrils daily. 02/09/20   Waldon Merl, PA-C  levocetirizine (XYZAL) 5 MG tablet Take 1 tablet (5 mg total) by mouth every evening. 02/09/20   Waldon Merl, PA-C  Multiple Vitamins-Minerals (MENS MULTIVITAMIN PLUS PO) Take by mouth daily.    [provider]    Family History Family History  Problem Relation Age of Onset   Hyperlipidemia Mother         Father,  MGM   Heart disease Father 40       cad-angioplasty   Hypertension Father        MGM   Esophageal cancer Father    Colon cancer Paternal Grandfather 41   Prostate cancer Maternal Grandfather    Coronary artery disease Maternal Grandfather    Diabetes Paternal Grandmother     Social History Social History   Tobacco Use   Smoking status: Never   Smokeless tobacco: Never  Vaping Use   Vaping Use: Never used  Substance Use Topics   Alcohol use: No    Alcohol/week: 0.0 standard drinks   Drug use: No     Allergies   Amoxicillin   Review of Systems Review of Systems  Constitutional:  Negative for appetite change, fatigue and fever.  Musculoskeletal:  Negative for neck pain and neck stiffness.  Skin:  Negative for rash.  All other systems reviewed and are negative.   Physical Exam Triage Vital Signs ED Triage Vitals  Enc Vitals Group     BP 01/27/21 1141 130/84     Pulse Rate 01/27/21 1141 77     Resp 01/27/21 1141 16     Temp 01/27/21 1141 98.4 F (36.9 C)     Temp Source 01/27/21 1141 Oral  SpO2 01/27/21 1141 99 %     Weight --      Height --      Head Circumference --      Peak Flow --      Pain Score 01/27/21 1139 3     Pain Loc --      Pain Edu? --      Excl. in GC? --    No data found.  Updated Vital Signs BP 130/84 (BP Location: Left Arm)   Pulse 77   Temp 98.4 F (36.9 C) (Oral)   Resp 16   SpO2 99%   Visual Acuity Right Eye Distance:   Left Eye Distance:   Bilateral Distance:    Right Eye Near:   Left Eye Near:    Bilateral Near:     Physical Exam Vitals and nursing note reviewed.  Constitutional:      General: He is not in acute distress.    Appearance: He is well-developed. He is not ill-appearing, toxic-appearing or diaphoretic.  HENT:     Head: Normocephalic and atraumatic.     Right Ear: Tympanic membrane and ear canal normal.     Left Ear: Tympanic membrane and ear canal normal.     Nose: Nose normal.      Right Sinus: No maxillary sinus tenderness or frontal sinus tenderness.     Left Sinus: No maxillary sinus tenderness or frontal sinus tenderness.     Mouth/Throat:     Lips: Pink.     Mouth: Mucous membranes are moist.     Pharynx: Oropharynx is clear. Uvula midline. Posterior oropharyngeal erythema present. No pharyngeal swelling, oropharyngeal exudate or uvula swelling.     Tonsils: No tonsillar exudate or tonsillar abscesses.  Cardiovascular:     Rate and Rhythm: Normal rate and regular rhythm.  Pulmonary:     Effort: Pulmonary effort is normal. No respiratory distress.     Breath sounds: Normal breath sounds. No stridor. No wheezing, rhonchi or rales.  Musculoskeletal:        General: Normal range of motion.     Cervical back: Normal range of motion and neck supple.  Lymphadenopathy:     Cervical: No cervical adenopathy.  Skin:    General: Skin is warm and dry.     Findings: No rash.  Neurological:     Mental Status: He is alert and oriented to person, place, and time.  Psychiatric:        Behavior: Behavior normal.     UC Treatments / Results  Labs (all labs ordered are listed, but only abnormal results are displayed) Labs Reviewed  POCT RAPID STREP A, ED / UC    EKG   Radiology No results found.  Procedures Procedures (including critical care time)  Medications Ordered in UC Medications - No data to display  Initial Impression / Assessment and Plan / UC Course  I have reviewed the triage vital signs and the nursing notes.  Pertinent labs & imaging results that were available during my care of the patient were reviewed by me and considered in my medical decision making (see chart for details).     Assessment & Plan Acute pharyngitis, unspecified etiology Symptomatic treatment, encouraged alternating Tylenol and Motrin, fluids and rest. F/u with PCP in 1 week as needed, sooner if worsening.   Final Clinical Impressions(s) / UC Diagnoses   Final  diagnoses:  Acute pharyngitis, unspecified etiology     Discharge Instructions      You  may take 500mg  acetaminophen every 4-6 hours or in combination with ibuprofen 400-600mg  every 6-8 hours as needed for pain, inflammation, and fever.  Be sure to well hydrated with clear liquids and get at least 8 hours of sleep at night, preferably more while sick.   Please follow up with family medicine in 1 week if needed, sooner if worsening.       ED Prescriptions   None    PDMP not reviewed this encounter.   , Lurene Shadow 01/27/21 1328

## 2021-01-27 NOTE — Discharge Instructions (Addendum)
You may take 500mg  acetaminophen every 4-6 hours or in combination with ibuprofen 400-600mg  every 6-8 hours as needed for pain, inflammation, and fever.  Be sure to well hydrated with clear liquids and get at least 8 hours of sleep at night, preferably more while sick.   Please follow up with family medicine in 1 week if needed, sooner if worsening.

## 2021-01-27 NOTE — ED Triage Notes (Signed)
Pt presents with sore throat xs 3 days. States ibuprofen gives some relief.

## 2021-02-04 ENCOUNTER — Telehealth: Payer: Self-pay

## 2021-02-04 NOTE — Telephone Encounter (Signed)
Appt given for Wednesday at 3:30.  Denies any swelling or redness but reports pain has returned as same symptoms post vas pain.

## 2021-02-04 NOTE — Telephone Encounter (Signed)
Patient is having pain and discomfort in testicle.  Had vasectomy in May of 2022.  Please advise.  Call back:  [phone] Thanks, Rosey Bath

## 2021-02-06 ENCOUNTER — Ambulatory Visit (INDEPENDENT_AMBULATORY_CARE_PROVIDER_SITE_OTHER): Payer: 59 | Admitting: Urology

## 2021-02-06 ENCOUNTER — Encounter: Payer: Self-pay | Admitting: Urology

## 2021-02-06 ENCOUNTER — Other Ambulatory Visit: Payer: Self-pay

## 2021-02-06 VITALS — BP 118/72 | HR 71

## 2021-02-06 DIAGNOSIS — Z9852 Vasectomy status: Secondary | ICD-10-CM

## 2021-02-06 DIAGNOSIS — N453 Epididymo-orchitis: Secondary | ICD-10-CM

## 2021-02-06 LAB — URINALYSIS, ROUTINE W REFLEX MICROSCOPIC
Bilirubin, UA: NEGATIVE
Glucose, UA: NEGATIVE
Ketones, UA: NEGATIVE
Leukocytes,UA: NEGATIVE
Nitrite, UA: NEGATIVE
Protein,UA: NEGATIVE
RBC, UA: NEGATIVE
Specific Gravity, UA: 1.015 (ref 1.005–1.030)
Urobilinogen, Ur: 0.2 mg/dL (ref 0.2–1.0)
pH, UA: 6.5 (ref 5.0–7.5)

## 2021-02-06 MED ORDER — SULFAMETHOXAZOLE-TRIMETHOPRIM 800-160 MG PO TABS
1.0000 | ORAL_TABLET | Freq: Two times a day (BID) | ORAL | 0 refills | Status: DC
Start: 2021-02-06 — End: 2021-02-06

## 2021-02-06 MED ORDER — SULFAMETHOXAZOLE-TRIMETHOPRIM 800-160 MG PO TABS: 1.0000 | ORAL_TABLET | Freq: Two times a day (BID) | ORAL | 0 refills | Status: DC

## 2021-02-06 NOTE — Patient Instructions (Signed)
Orchitis  Orchitis is inflammation of a testicle. Testicles are the male organs that produce sperm. The testicles are held in a fleshy sac (scrotum) located behind the penis. Orchitis usually affects only one testicle, but itcan affect both. Orchitis is caused by infection. Many kinds of bacteria and viruses can causethis infection. The condition can develop suddenly. What are the causes? This condition may be caused by: Infection from viruses or bacteria. Other organisms, such as fungi or parasites (rare). This is common in men who have a weak body defense system (immune system), such as men with HIV. Bacteria Bacterial orchitis often occurs along with an infection of the tube that collects and stores sperm (epididymis). In men who are not sexually active, this infection usually starts as a urinary tract infection and spreads to the testicle. In sexually active men, sexually transmitted infections (STIs) are the most common cause of bacterial orchitis. These can include: Gonorrhea. Chlamydia. Viruses Mumps is the most common cause of viral orchitis, though mumps is now rare in many areas because of vaccination. Other viruses that can cause orchitis include: The chickenpox virus (varicella-zoster virus). The virus that causes mononucleosis (Epstein-Barr virus). What increases the risk? The following factors may make you more likely to develop this condition: For viral orchitis: Not having been vaccinated against mumps. For bacterial orchitis: Having had frequent urinary tract infections. Engaging in high-risk sexual behaviors, such as having multiple sexual partners or having sex without using a condom. Having a sexual partner with an STI. Having had urinary tract surgery. Using a tube that is passed through the penis to drain urine (Foley catheter). Having an enlarged prostate gland. What are the signs or symptoms? The most common symptoms of orchitis are swelling and pain in the  scrotum. Other signs and symptoms may include: Feeling generally sick (malaise). Fever and chills. Painful urination. Painful ejaculation. Headache. Fatigue. Nausea. Blood or discharge from the penis. Swollen lymph nodes in the groin area (inguinal nodes). How is this diagnosed? This condition may be diagnosed based on: Your symptoms. Your health care provider may suspect orchitis if you have a painful, swollen testicle along with other signs and symptoms of the condition. A physical exam. You may also have other tests, including: A blood test to check for signs of infection. A urine test to check for a urinary tract infection or STI. Using a swab to collect a fluid sample from the tip of the penis to test for STIs. Taking an image of the testicle using sound waves and a computer (testicular ultrasound). How is this treated? Treatment for this condition depends on the cause.  For bacterial orchitis, your health care provider may prescribe antibioticmedicines. Bacterial infections usually clear up within a few days. For both viral infections and bacterial infections, you may be treated with: Rest. Anti-inflammatory medicines. Pain medicines. Raising (elevating) the scrotum with a towel or pillow underneath and applying ice. Follow these instructions at home: Rest as directed by your health care provider. Take over-the-counter and prescription medicines only as told by your health care provider. If you were prescribed an antibiotic medicine, take it as told by your health care provider. Do not stop taking the antibiotic even if you start to feel better. Do not have sex until your health care provider says it is okay to do so. Elevate your scrotum and apply ice as directed: Put ice in a plastic bag. Place a small towel or pillow between your legs. Rest your scrotum on the pillow or   towel. Place another towel between your skin and the plastic bag. Leave the ice on for 20 minutes, 2-3  times a day. Keep all follow-up visits as told by your health care provider. This is important. Contact a health care provider if: You have a fever. Pain and swelling have not gotten better after 3 days. Get help right away if: Your pain is getting worse. The swelling in your testicle gets worse. Summary Orchitis is inflammation of a testicle. It is caused by an infection from bacteria or a virus. The most common symptoms of orchitis are swelling and pain in the scrotum. Treatment for this condition depends on the cause. It may include medicines to fight the infection, reduce inflammation, and relieve the pain. Follow your health care provider's instructions about resting, icing, not having sex, and taking medicines. This information is not intended to replace advice given to you by your health care provider. Make sure you discuss any questions you have with your healthcare provider. Document Revised: 07/10/2017 Document Reviewed: 07/10/2017 Elsevier Patient Education  2022 [employer]vier Inc.  

## 2021-02-06 NOTE — Progress Notes (Signed)
02/06/2021 4:05 PM   Alejandro Weber March 29, 1986 [id number]  Referring provider: Allwardt, Crist Infante, PA-C [address] [place],  [place] [address]  Left testicular pain   HPI: Mr Alejandro Weber is a 35yo here for evaluation of left testicular pain.  He underwent vasectomy 3 months ago and was doing well nutil 1 month ago when he developed left testicular pain. The pain is sharp, intermittent moderate to severe and nonraditing. It is exacerbated by activity. No trauma to the testes. No significant LUTS. No other associated symptoms. No exacerbating/alleviaitng events.   PMH: Past Medical History:  Diagnosis Date   Anxiety    History of chicken pox    childhood   History of kidney stones 2006    Surgical History: Past Surgical History:  Procedure Laterality Date   NO PAST SURGERIES  12/22/2011    Home Medications:  Allergies as of 02/06/2021       Reactions   Amoxicillin Hives        Medication List        Accurate as of February 06, 2021  4:05 PM. If you have any questions, ask your nurse or doctor.          escitalopram 10 MG tablet Commonly known as: Lexapro Take 1 tablet (10 mg total) by mouth daily.   escitalopram 10 MG tablet Commonly known as: LEXAPRO TAKE 1 TABLET BY MOUTH  DAILY   fluticasone 50 MCG/ACT nasal spray Commonly known as: FLONASE Place 2 sprays into both nostrils daily.   levocetirizine 5 MG tablet Commonly known as: Xyzal Take 1 tablet (5 mg total) by mouth every evening.   MENS MULTIVITAMIN PLUS PO Take by mouth daily.        Allergies:  Allergies  Allergen Reactions   Amoxicillin Hives    Family History: Family History  Problem Relation Age of Onset   Hyperlipidemia Mother        Father,  MGM   Heart disease Father 85       cad-angioplasty   Hypertension Father        MGM   Esophageal cancer Father    Colon cancer Paternal Grandfather 51   Prostate cancer Maternal Grandfather    Coronary artery disease Maternal  Grandfather    Diabetes Paternal Grandmother     Social History:  reports that he has never smoked. He has never used smokeless tobacco. He reports that he does not drink alcohol and does not use drugs.  ROS: All other review of systems were reviewed and are negative except what is noted above in HPI  Physical Exam: BP 118/72   Pulse 71   Constitutional:  Alert and oriented, No acute distress. HEENT: [place] AT, moist mucus membranes.  Trachea midline, no masses. Cardiovascular: No clubbing, cyanosis, or edema. Respiratory: Normal respiratory effort, no increased work of breathing. GI: Abdomen is soft, nontender, nondistended, no abdominal masses GU: No CVA tenderness. Circumcised phallus. No masses/lesions on penis, testis, scrotum. Left epididymitis. Lymph: No cervical or inguinal lymphadenopathy. Skin: No rashes, bruises or suspicious lesions. Neurologic: Grossly intact, no focal deficits, moving all 4 extremities. Psychiatric: Normal mood and affect.  Laboratory Data: Lab Results  Component Value Date   WBC 7.2 05/02/2019   HGB 15.2 05/02/2019   HCT 43.9 05/02/2019   MCV 89.0 05/02/2019   PLT 287.0 05/02/2019    Lab Results  Component Value Date   CREATININE 0.99 05/02/2019    No results found for: PSA  No results  found for: TESTOSTERONE  Lab Results  Component Value Date   HGBA1C 5.2 05/02/2019    Urinalysis    Component Value Date/Time   COLORURINE YELLOW 06/20/2016 0849   APPEARANCEUR CLEAR 06/20/2016 0849   LABSPEC 1.015 06/20/2016 0849   PHURINE 7.0 06/20/2016 0849   GLUCOSEU NEGATIVE 06/20/2016 0849   HGBUR NEGATIVE 06/20/2016 0849   BILIRUBINUR NEGATIVE 06/20/2016 0849   KETONESUR NEGATIVE 06/20/2016 0849   PROTEINUR NEG 12/22/2011 1408   UROBILINOGEN 0.2 06/20/2016 0849   NITRITE NEGATIVE 06/20/2016 0849   LEUKOCYTESUR NEGATIVE 06/20/2016 0849    No results found for: LABMICR, WBCUA, RBCUA, LABEPIT, MUCUS, BACTERIA  Pertinent Imaging:  No  results found for this or any previous visit.  No results found for this or any previous visit.  No results found for this or any previous visit.  No results found for this or any previous visit.  No results found for this or any previous visit.  No results found for this or any previous visit.  No results found for this or any previous visit.  No results found for this or any previous visit.   Assessment & Plan:    1. Orchitis and epididymitis -bactrim DS BID for 7 days -Scrotal US, will call with results   No follow-ups on file.  Wilkie Aye, MD  [facility] [place]

## 2021-02-06 NOTE — Progress Notes (Signed)
Urological Symptom Review  Patient is experiencing the following symptoms: none   Review of Systems  Gastrointestinal (upper)  : Negative for upper GI symptoms  Gastrointestinal (lower) : Negative for lower GI symptoms  Constitutional : Negative for symptoms  Skin: Negative for skin symptoms  Eyes: Negative for eye symptoms  Ear/Nose/Throat : Negative for Ear/Nose/Throat symptoms  Hematologic/Lymphatic: Negative for Hematologic/Lymphatic symptoms  Cardiovascular : Negative for cardiovascular symptoms  Respiratory : Negative for respiratory symptoms  Endocrine: Negative for endocrine symptoms  Musculoskeletal: Negative for musculoskeletal symptoms  Neurological: Negative for neurological symptoms  Psychologic: Depression Anxiety  

## 2021-02-07 ENCOUNTER — Ambulatory Visit (HOSPITAL_COMMUNITY)
Admission: RE | Admit: 2021-02-07 | Discharge: 2021-02-07 | Disposition: A | Discharge status: Home / Self Care | Payer: 59 | Source: Ambulatory Visit | Attending: Urology | Admitting: Urology

## 2021-02-07 DIAGNOSIS — N453 Epididymo-orchitis: Secondary | ICD-10-CM | POA: Diagnosis not present

## 2021-02-15 ENCOUNTER — Encounter: Payer: Self-pay | Admitting: Physician Assistant

## 2021-02-15 ENCOUNTER — Ambulatory Visit (INDEPENDENT_AMBULATORY_CARE_PROVIDER_SITE_OTHER): Payer: 59 | Admitting: Physician Assistant

## 2021-02-15 ENCOUNTER — Other Ambulatory Visit: Payer: Self-pay

## 2021-02-15 VITALS — BP 121/81 | HR 66 | Temp 98.2°F | Ht 69.0 in | Wt 178.2 lb

## 2021-02-15 DIAGNOSIS — F411 Generalized anxiety disorder: Secondary | ICD-10-CM

## 2021-02-15 DIAGNOSIS — E785 Hyperlipidemia, unspecified: Secondary | ICD-10-CM | POA: Diagnosis not present

## 2021-02-15 DIAGNOSIS — Z131 Encounter for screening for diabetes mellitus: Secondary | ICD-10-CM

## 2021-02-15 DIAGNOSIS — Z0000 Encounter for general adult medical examination without abnormal findings: Secondary | ICD-10-CM | POA: Diagnosis not present

## 2021-02-15 LAB — COMPREHENSIVE METABOLIC PANEL
ALT: 23 U/L (ref 0–53)
AST: 21 U/L (ref 0–37)
Albumin: 4.5 g/dL (ref 3.5–5.2)
Alkaline Phosphatase: 59 U/L (ref 39–117)
BUN: 14 mg/dL (ref 6–23)
CO2: 26 mEq/L (ref 19–32)
Calcium: 9.3 mg/dL (ref 8.4–10.5)
Chloride: 104 mEq/L (ref 96–112)
Creatinine, Ser: 1.17 mg/dL (ref 0.40–1.50)
GFR: 80.28 mL/min (ref >60.00)
Glucose, Bld: 99 mg/dL (ref 70–99)
Potassium: 4.3 mEq/L (ref 3.5–5.1)
Sodium: 137 mEq/L (ref 135–145)
Total Bilirubin: 0.6 mg/dL (ref 0.2–1.2)
Total Protein: 7.5 g/dL (ref 6.0–8.3)

## 2021-02-15 LAB — CBC WITH DIFFERENTIAL/PLATELET
Basophils Absolute: 0 10*3/uL (ref 0.0–0.1)
Basophils Relative: 0.6 % (ref 0.0–3.0)
Eosinophils Absolute: 0.2 10*3/uL (ref 0.0–0.7)
Eosinophils Relative: 4.9 % (ref 0.0–5.0)
HCT: 42.1 % (ref 39.0–52.0)
Hemoglobin: 14.5 g/dL (ref 13.0–17.0)
Lymphocytes Relative: 41.5 % (ref 12.0–46.0)
Lymphs Abs: 2 10*3/uL (ref 0.7–4.0)
MCHC: 34.4 g/dL (ref 30.0–36.0)
MCV: 89 fl (ref 78.0–100.0)
Monocytes Absolute: 0.6 10*3/uL (ref 0.1–1.0)
Monocytes Relative: 11.5 % (ref 3.0–12.0)
Neutro Abs: 2 10*3/uL (ref 1.4–7.7)
Neutrophils Relative %: 41.5 % — ABNORMAL LOW (ref 43.0–77.0)
Platelets: 317 10*3/uL (ref 150.0–400.0)
RBC: 4.73 Mil/uL (ref 4.22–5.81)
RDW: 12.9 % (ref 11.5–15.5)
WBC: 4.9 10*3/uL (ref 4.0–10.5)

## 2021-02-15 LAB — LIPID PANEL
Cholesterol: 193 mg/dL (ref 0–200)
HDL: 39.7 mg/dL (ref >39.00)
LDL Cholesterol: 132 mg/dL — ABNORMAL HIGH (ref 0–99)
NonHDL: 153.67
Total CHOL/HDL Ratio: 5
Triglycerides: 106 mg/dL (ref 0.0–149.0)
VLDL: 21.2 mg/dL (ref 0.0–40.0)

## 2021-02-15 MED ORDER — ESCITALOPRAM OXALATE 10 MG PO TABS: 10.0000 mg | ORAL_TABLET | Freq: Every day | ORAL | 3 refills | Status: DC

## 2021-02-15 NOTE — Patient Instructions (Signed)
Good to see you again today!  Please go to the lab for blood work and I will send results through MyChart. Call if any concerns.

## 2021-02-15 NOTE — Progress Notes (Signed)
Established Patient Office Visit  Subjective:  Patient ID: Alejandro Weber, male    DOB: [birth year]  Age: 35 y.o. MRN: [id number]  CC:  Chief Complaint  Patient presents with   Annual Exam    HPI Alejandro Weber presents for annual CPE. He has had vasectomy done since last visit. He has also recovered from COVID and epididymitis.   Acute concerns: None  Health maintenance: Lifestyle/ exercise: Chasing kids, otherwise nothing extra right now Nutrition: Usually cook at home, occasionally out to eat Mental health: Stable on Lexapro 10 mg for the last few years   Caffeine: Coffee in the mornings  Sleep: No concerns  Substance use: None Sexual activity: Monogamous  Immunizations: UTD Dental & Vision: UTD    Past Medical History:  Diagnosis Date   Anxiety    History of chicken pox    childhood   History of kidney stones 2006    Past Surgical History:  Procedure Laterality Date   NO PAST SURGERIES  12/22/2011   VASECTOMY Bilateral 11/2020    Family History  Problem Relation Age of Onset   Hyperlipidemia Mother        Father,  MGM   Heart disease Father 70       cad-angioplasty   Hypertension Father        MGM   Esophageal cancer Father    Colon cancer Paternal Grandfather 91   Prostate cancer Maternal Grandfather    Coronary artery disease Maternal Grandfather    Diabetes Paternal Grandmother     Social History   Socioeconomic History   Marital status: Married    Spouse name: Not on file   Number of children: 3   Years of education: Not on file   Highest education level: Not on file  Occupational History   Occupation: IT trainer  Tobacco Use   Smoking status: Never   Smokeless tobacco: Never  Vaping Use   Vaping Use: Never used  Substance and Sexual Activity   Alcohol use: No    Alcohol/week: 0.0 standard drinks   Drug use: No   Sexual activity: Yes    Partners: Female  Other Topics Concern   Not on file  Social History Narrative   Not on file    Social Determinants of Health   Financial Resource Strain: Not on file  Food Insecurity: Not on file  Transportation Needs: Not on file  Physical Activity: Not on file  Stress: Not on file  Social Connections: Not on file  Intimate Partner Violence: Not on file    Outpatient Medications Prior to Visit  Medication Sig Dispense Refill   escitalopram (LEXAPRO) 10 MG tablet TAKE 1 TABLET BY MOUTH  DAILY 90 tablet 1   fluticasone (FLONASE) 50 MCG/ACT nasal spray Place 2 sprays into both nostrils daily. 16 g 1   levocetirizine (XYZAL) 5 MG tablet Take 1 tablet (5 mg total) by mouth every evening. 30 tablet 1   Multiple Vitamins-Minerals (MENS MULTIVITAMIN PLUS PO) Take by mouth daily.     escitalopram (LEXAPRO) 10 MG tablet Take 1 tablet (10 mg total) by mouth daily. 90 tablet 0   sulfamethoxazole-trimethoprim (BACTRIM DS) 800-160 MG tablet Take 1 tablet by mouth every 12 (twelve) hours. 14 tablet 0   No facility-administered medications prior to visit.    Allergies  Allergen Reactions   Amoxicillin Hives    ROS Review of Systems  Constitutional:  Negative for activity change, appetite change and unexpected weight change.  HENT:  Negative  for congestion.   Eyes:  Negative for visual disturbance.  Respiratory:  Negative for apnea and shortness of breath.   Cardiovascular:  Negative for chest pain.  Gastrointestinal:  Negative for abdominal pain and blood in stool.  Endocrine: Negative for polydipsia, polyphagia and polyuria.  Genitourinary:  Negative for difficulty urinating.  Musculoskeletal:  Negative for arthralgias.  Skin:  Negative for rash.  Neurological:  Negative for seizures, weakness and headaches.  Psychiatric/Behavioral:  Negative for sleep disturbance and suicidal ideas.      Objective:    Physical Exam Vitals and nursing note reviewed.  Constitutional:      Appearance: Normal appearance.  HENT:     Head: Normocephalic and atraumatic.     Right Ear:  Tympanic membrane, ear canal and external ear normal.     Left Ear: Tympanic membrane, ear canal and external ear normal.     Nose: Nose normal.  Cardiovascular:     Rate and Rhythm: Normal rate and regular rhythm.     Pulses: Normal pulses.     Heart sounds: Normal heart sounds. No murmur heard. Pulmonary:     Effort: Pulmonary effort is normal.     Breath sounds: Normal breath sounds.  Abdominal:     General: Abdomen is flat. Bowel sounds are normal.     Palpations: Abdomen is soft.  Skin:    General: Skin is warm and dry.  Neurological:     General: No focal deficit present.     Mental Status: He is alert and oriented to person, place, and time.     Gait: Gait normal.  Psychiatric:        Mood and Affect: Mood normal.        Behavior: Behavior normal.    BP 121/81   Pulse 66   Temp 98.2 F (36.8 C)   Ht 5\' 9"  (1.753 m)   Wt 178 lb 3.2 oz (80.8 kg)   SpO2 97%   BMI 26.32 kg/m  Wt Readings from Last 3 Encounters:  02/15/21 178 lb 3.2 oz (80.8 kg)  09/26/20 180 lb (81.6 kg)  08/17/20 179 lb (81.2 kg)     Health Maintenance Due  Topic Date Due   Hepatitis C Screening  Never done   INFLUENZA VACCINE  02/04/2021    There are no preventive care reminders to display for this patient.  Lab Results  Component Value Date   TSH 0.87 06/20/2016   Lab Results  Component Value Date   WBC 7.2 05/02/2019   HGB 15.2 05/02/2019   HCT 43.9 05/02/2019   MCV 89.0 05/02/2019   PLT 287.0 05/02/2019   Lab Results  Component Value Date   NA 139 05/02/2019   K 4.2 05/02/2019   CO2 25 05/02/2019   GLUCOSE 83 05/02/2019   BUN 16 05/02/2019   CREATININE 0.99 05/02/2019   BILITOT 0.5 05/02/2019   ALKPHOS 56 05/02/2019   AST 23 05/02/2019   ALT 20 05/02/2019   PROT 7.7 05/02/2019   ALBUMIN 4.8 05/02/2019   CALCIUM 9.4 05/02/2019   GFR 86.25 05/02/2019   Lab Results  Component Value Date   CHOL 208 (H) 05/02/2019   Lab Results  Component Value Date   HDL 57.00  05/02/2019   Lab Results  Component Value Date   LDLCALC 136 (H) 05/02/2019   Lab Results  Component Value Date   TRIG 71.0 05/02/2019   Lab Results  Component Value Date   CHOLHDL 4 05/02/2019  Lab Results  Component Value Date   HGBA1C 5.2 05/02/2019      Assessment & Plan:   Problem List Items Addressed This Visit       Other   GAD (generalized anxiety disorder)   Relevant Medications   escitalopram (LEXAPRO) 10 MG tablet   Other Visit Diagnoses     Encounter for annual physical exam    -  Primary   Relevant Orders   CBC with Differential/Platelet   Comprehensive metabolic panel   Lipid panel   Dyslipidemia, goal to be determined       Relevant Orders   Lipid panel   Diabetes mellitus screening       Relevant Orders   Comprehensive metabolic panel       Meds ordered this encounter  Medications   escitalopram (LEXAPRO) 10 MG tablet    Sig: Take 1 tablet (10 mg total) by mouth daily.    Dispense:  90 tablet    Refill:  3    Follow-up: Return in about 1 year (around 02/15/2022) for fasting labs/ CPE / med check .   PLAN: Age-appropriate screening and counseling performed today. Will check labs and call with results. Preventive measures discussed and printed in AVS for patient. Encouraged more aerobic exercise. Consider fish oil supplement for high cholesterol. Doing well on Lexapro 10 mg daily and refilled this today.   Juana Haralson M Fritzi Scripter, PA-C

## 2021-03-01 ENCOUNTER — Other Ambulatory Visit: Payer: Self-pay

## 2021-03-01 ENCOUNTER — Telehealth (INDEPENDENT_AMBULATORY_CARE_PROVIDER_SITE_OTHER): Payer: 59 | Admitting: Urology

## 2021-03-01 DIAGNOSIS — N50812 Left testicular pain: Secondary | ICD-10-CM | POA: Diagnosis not present

## 2021-03-01 NOTE — Progress Notes (Signed)
03/01/2021 2:07 PM   Alejandro Weber [birth year] [id number]  Referring provider: Allwardt, Crist Infante, PA-C [address] [place],  [place] [address]  Patient location: home Physician location: office I connected with  Alejandro Weber on 03/01/21 by a video enabled telemedicine application and verified that I am speaking with the correct person using two identifiers.   I discussed the limitations of evaluation and management by telemedicine. The patient expressed understanding and agreed to proceed.    Followup left epididymitis  HPI: Mr Alejandro Weber is a 35yo here for followup for left testis pain/epididymitis. He took bactrim Ds for 1 week and the pain resolved. No worsening LUTS. Scrotal US shows no evidence of testis mass or epididymitis/orchitis.  No issues today.    PMH: Past Medical History:  Diagnosis Date   Anxiety    History of chicken pox    childhood   History of kidney stones 2006    Surgical History: Past Surgical History:  Procedure Laterality Date   NO PAST SURGERIES  12/22/2011   VASECTOMY Bilateral 11/2020    Home Medications:  Allergies as of 03/01/2021       Reactions   Amoxicillin Hives        Medication List        Accurate as of March 01, 2021  2:07 PM. If you have any questions, ask your nurse or doctor.          escitalopram 10 MG tablet Commonly known as: LEXAPRO TAKE 1 TABLET BY MOUTH  DAILY   escitalopram 10 MG tablet Commonly known as: Lexapro Take 1 tablet (10 mg total) by mouth daily.   fluticasone 50 MCG/ACT nasal spray Commonly known as: FLONASE Place 2 sprays into both nostrils daily.   levocetirizine 5 MG tablet Commonly known as: Xyzal Take 1 tablet (5 mg total) by mouth every evening.   MENS MULTIVITAMIN PLUS PO Take by mouth daily.        Allergies:  Allergies  Allergen Reactions   Amoxicillin Hives    Family History: Family History  Problem Relation Age of Onset   Hyperlipidemia Mother         Father,  MGM   Heart disease Father 1       cad-angioplasty   Hypertension Father        MGM   Esophageal cancer Father    Colon cancer Paternal Grandfather 51   Prostate cancer Maternal Grandfather    Coronary artery disease Maternal Grandfather    Diabetes Paternal Grandmother     Social History:  reports that he has never smoked. He has never used smokeless tobacco. He reports that he does not drink alcohol and does not use drugs.  ROS: All other review of systems were reviewed and are negative except what is noted above in HPI   Laboratory Data: Lab Results  Component Value Date   WBC 4.9 02/15/2021   HGB 14.5 02/15/2021   HCT 42.1 02/15/2021   MCV 89.0 02/15/2021   PLT 317.0 02/15/2021    Lab Results  Component Value Date   CREATININE 1.17 02/15/2021    No results found for: PSA  No results found for: TESTOSTERONE  Lab Results  Component Value Date   HGBA1C 5.2 05/02/2019    Urinalysis    Component Value Date/Time   COLORURINE YELLOW 06/20/2016 0849   APPEARANCEUR Clear 02/06/2021 1536   LABSPEC 1.015 06/20/2016 0849   PHURINE 7.0 06/20/2016 0849   GLUCOSEU Negative 02/06/2021 1536  GLUCOSEU NEGATIVE 06/20/2016 0849   HGBUR NEGATIVE 06/20/2016 0849   BILIRUBINUR Negative 02/06/2021 1536   KETONESUR NEGATIVE 06/20/2016 0849   PROTEINUR Negative 02/06/2021 1536   PROTEINUR NEG 12/22/2011 1408   UROBILINOGEN 0.2 06/20/2016 0849   NITRITE Negative 02/06/2021 1536   NITRITE NEGATIVE 06/20/2016 0849   LEUKOCYTESUR Negative 02/06/2021 1536    Lab Results  Component Value Date   LABMICR Comment 02/06/2021    Pertinent Imaging: Scrotal US 02/07/2021: Images reviewed and discussed with the patient No results found for this or any previous visit.  No results found for this or any previous visit.  No results found for this or any previous visit.  No results found for this or any previous visit.  No results found for this or any previous  visit.  No results found for this or any previous visit.  No results found for this or any previous visit.  No results found for this or any previous visit.   Assessment & Plan:    1. Left testicular pain -Resolved   No follow-ups on file.  Wilkie Aye, MD  [facility]

## 2021-03-12 ENCOUNTER — Encounter: Payer: Self-pay | Admitting: Urology

## 2021-03-12 NOTE — Patient Instructions (Signed)
Vasectomy, Care After This sheet gives you information about how to care for yourself after your procedure. Your health care provider may also give you more specific instructions. If you have problems or questions, contact your health careprovider. What can I expect after the procedure? After the procedure, it is common to have: Mild pain, swelling, or discomfort in your scrotum or redness on your scrotum. Some blood coming from your incisions or puncture sites for 1 or 2 days. Blood in your semen. Follow these instructions at home: Medicines Take over-the-counter and prescription medicines only as told by your health care provider. Avoid taking any medicines that contain aspirin or NSAIDs, such as ibuprofen. These medicines can make bleeding worse. Activity For the first 2 days after surgery, avoid physical activity and exercise that requires a lot of energy. Ask your health care provider what activities are safe for you. Do not take part in sports or perform heavy physical labor until your pain has improved, or until your health care provider says it is okay. You may have limits on the amount of weight you can lift as told by your health care provider. Do not ejaculate for at least 1 week after the procedure, or for as long as you are told. You may resume sexual activity 7-10 days after your procedure, or when your health care provider approves. Use a different method of birth control (contraception) until you have had test results that confirm that there is no sperm in your semen. Scrotal support Use scrotal support, such as a jockstrap or underwear with a supportive pouch, as needed for 1 week after your procedure. If you feel discomfort in your scrotum, you may remove the scrotal support to see if the discomfort is relieved. Sometimes scrotal support can press on the scrotum and cause or worsen discomfort. If your skin gets irritated, you may add some germ-free (sterile), fluffed bandages or  a clean washcloth to the scrotal support. Managing pain and swelling If directed, put ice on the affected area. To do this: Put ice in a plastic bag. Place a towel between your skin and the bag. Leave the ice on for 20 minutes, 2-3 times a day. Remove the ice if your skin turns bright red. This is very important. If you cannot feel pain, heat, or cold, you have a greater risk of damage to the area.  General instructions  Check your incisions or puncture sites every day for signs of infection. Check for: Redness, swelling, or more pain. Fluid or blood. Warmth. Pus or a bad smell. Leave stitches (sutures) in place. The sutures will dissolve on their own and do not need to be removed. Keep all follow-up visits. This is important because you will need a test to confirm that there is no sperm in your semen. Multiple ejaculations are needed to clear out sperm that were beyond the vasectomy site. You will need one test result showing that there is no sperm in your semen before you can resume unprotected sex. This may take 2-4 months after your procedure. If you were given a sedative during the procedure, it can affect you for several hours. Do not drive or operate machinery until your health care provider says that it is safe.  Contact a health care provider if: You have redness, swelling, or more pain around an incision or puncture site, or in your scrotum area. You have bleeding from an incision or puncture site. You have pus or a bad smell coming from an incision   or puncture site. You have a fever. An incision or puncture site opens up. Get help right away if: You develop a rash. You have trouble breathing. Summary After your procedure, it is common to have mild pain, swelling, redness, or discomfort in your scrotum. For the first 2 days after surgery, avoid physical activity and exercise that requires a lot of energy. Put ice on the affected area. Leave the ice on for 20 minutes, 2-3  times a day. If you were given a sedative during the procedure, it can affect you for several hours. Do not drive or operate machinery until your health care provider says that it is safe. This information is not intended to replace advice given to you by your health care provider. Make sure you discuss any questions you have with your healthcare provider. Document Revised: 11/10/2019 Document Reviewed: 11/10/2019 Elsevier Patient Education  2022 Elsevier Inc.  

## 2021-05-09 ENCOUNTER — Ambulatory Visit (INDEPENDENT_AMBULATORY_CARE_PROVIDER_SITE_OTHER): Payer: 59 | Admitting: Physician Assistant

## 2021-05-09 ENCOUNTER — Other Ambulatory Visit: Payer: Self-pay

## 2021-05-09 VITALS — BP 143/101 | HR 74 | Temp 97.6°F | Ht 69.0 in | Wt 180.2 lb

## 2021-05-09 DIAGNOSIS — R0683 Snoring: Secondary | ICD-10-CM | POA: Diagnosis not present

## 2021-05-09 DIAGNOSIS — F411 Generalized anxiety disorder: Secondary | ICD-10-CM

## 2021-05-09 DIAGNOSIS — F331 Major depressive disorder, recurrent, moderate: Secondary | ICD-10-CM

## 2021-05-09 MED ORDER — BUPROPION HCL ER (SR) 150 MG PO TB12: 150.0000 mg | ORAL_TABLET | Freq: Two times a day (BID) | ORAL | 0 refills | Status: DC

## 2021-05-09 NOTE — Progress Notes (Signed)
Subjective:    Patient ID: Alejandro Weber, male    DOB: [birth year], 35 y.o.   MRN: [id number]  No chief complaint on file.   HPI Patient is in today for recheck on anxiety and depression.  He states that his wife made the appointment for him today and also encouraged him to come in.  It is very hard for him to talk about what he has been feeling, but knows and acknowledges that this is important to discuss that he has not been feeling well.  He states that his mental health is starting to affect his relationships and life.  He is starting to lash out at coworkers and his family.  He has 3 young children and he is happily married.  He says his family is everything to him.  He is upset with himself that he has been feeling so angry.  He takes a lot of work home with him, usually about 50 hours per week. Works as a IT trainer.  States that he has always been highly driven and motivated.  He puts a lot of pressure on himself.  He states that he has been feeling like a failure lately.  His cousin had cystic fibrosis and did end up committing suicide.  Patient states that he would never do this to his family and he has no plan or intentions, but he does empathize with the feeling of just wanting everything to go away and get better.  He has been on and off of Lexapro over the years.  He states that he has been taking 10 mg daily for a while now.  He does not want to be on medication at all, but states that if it is helpful he will continue to take it.  Started snoring badly about one year ago and he states him and his wife have to sleep in separate bedrooms now. He feels tired a lot. Averages about 6.5 hours per night. He has never had a sleep study done.  Says that he is still intimate with his wife and they have a good relationship.   Past Medical History:  Diagnosis Date   Anxiety    History of chicken pox    childhood   History of kidney stones 2006    Past Surgical History:  Procedure  Laterality Date   NO PAST SURGERIES  12/22/2011   VASECTOMY Bilateral 11/2020    Family History  Problem Relation Age of Onset   Hyperlipidemia Mother        Father,  MGM   Heart disease Father 16       cad-angioplasty   Hypertension Father        MGM   Esophageal cancer Father    Colon cancer Paternal Grandfather 42   Prostate cancer Maternal Grandfather    Coronary artery disease Maternal Grandfather    Diabetes Paternal Grandmother     Social History   Tobacco Use   Smoking status: Never   Smokeless tobacco: Never  Vaping Use   Vaping Use: Never used  Substance Use Topics   Alcohol use: No    Alcohol/week: 0.0 standard drinks   Drug use: No     Allergies  Allergen Reactions   Amoxicillin Hives    Review of Systems REFER TO HPI FOR PERTINENT POSITIVES AND NEGATIVES      Objective:     BP (!) 143/101   Pulse 74   Temp 97.6 F (36.4 C)   Ht 5\' 9"  (1.753  m)   Wt 180 lb 3.2 oz (81.7 kg)   SpO2 98%   BMI 26.61 kg/m   Wt Readings from Last 3 Encounters:  05/09/21 180 lb 3.2 oz (81.7 kg)  02/15/21 178 lb 3.2 oz (80.8 kg)  09/26/20 180 lb (81.6 kg)    BP Readings from Last 3 Encounters:  05/09/21 (!) 143/101  02/15/21 121/81  02/06/21 118/72     Physical Exam  TALK ONLY TEARFUL AT TIMES. SAD AFFECT.     Assessment & Plan:   Problem List Items Addressed This Visit       Other   GAD (generalized anxiety disorder) - Primary   Relevant Medications   buPROPion (WELLBUTRIN SR) 150 MG 12 hr tablet   Other Relevant Orders   Ambulatory referral to Psychology   Other Visit Diagnoses     Moderate episode of recurrent major depressive disorder (HCC)       Relevant Medications   buPROPion (WELLBUTRIN SR) 150 MG 12 hr tablet   Other Relevant Orders   Ambulatory referral to Psychology   Loud snoring       Relevant Orders   Ambulatory referral to Sleep Studies        Meds ordered this encounter  Medications   buPROPion (WELLBUTRIN SR)  150 MG 12 hr tablet    Sig: Take 1 tablet (150 mg total) by mouth 2 (two) times daily.    Dispense:  60 tablet    Refill:  0   1. GAD (generalized anxiety disorder) 2. Moderate episode of recurrent major depressive disorder (HCC) -Worse -PHQ9 - 16 -GAD- 13 -I discussed several options with the patient today.  He agrees to continue Lexapro 10 mg at this time and also agrees to a trial of Wellbutrin.  Possible side effects discussed with patient.  If he experiences any sudden thoughts of suicide, he is to call the office or [phone] right away. -He also agrees to counseling and I have placed a referral for him today. -Close follow-up with me in 2 weeks  3. Loud snoring I suspect he may have obstructive sleep apnea and I have sent a referral for a sleep study to be done.  This could be contributing to his fatigue and anxiety and depression.   This note was prepared with assistance of Conservation officer, historic buildings. Occasional wrong-word or sound-a-like substitutions may have occurred due to the inherent limitations of voice recognition software.  Time Spent: 39 minutes of total time was spent on the date of the encounter performing the following actions: chart review prior to seeing the patient, obtaining history, performing a medically necessary exam, counseling on the treatment plan, placing orders, and documenting in our EHR.    Trevaun Rendleman M Fathima Bartl, PA-C

## 2021-05-09 NOTE — Patient Instructions (Signed)
Thank you for coming in.  Continue the Lexapro. Add Wellbutrin. Start with one in the mornings for 5 days, and then increase to twice daily if tolerating well.  I have placed a referral to [facility] at [employer]. You may call them to schedule an appointment at [phone]. Please give them your name, number, and state that a referral has been placed.   Consider taking a leave from work.  I have also placed a referral for sleep study.   Call [phone] immediately if any thoughts of suicide.

## 2021-05-19 DIAGNOSIS — I10 Essential (primary) hypertension: Secondary | ICD-10-CM | POA: Insufficient documentation

## 2021-05-23 ENCOUNTER — Encounter: Payer: Self-pay | Admitting: Physician Assistant

## 2021-05-23 ENCOUNTER — Telehealth (INDEPENDENT_AMBULATORY_CARE_PROVIDER_SITE_OTHER): Payer: 59 | Admitting: Physician Assistant

## 2021-05-23 DIAGNOSIS — R0683 Snoring: Secondary | ICD-10-CM | POA: Diagnosis not present

## 2021-05-23 DIAGNOSIS — F331 Major depressive disorder, recurrent, moderate: Secondary | ICD-10-CM | POA: Diagnosis not present

## 2021-05-23 DIAGNOSIS — R030 Elevated blood-pressure reading, without diagnosis of hypertension: Secondary | ICD-10-CM

## 2021-05-23 DIAGNOSIS — F411 Generalized anxiety disorder: Secondary | ICD-10-CM

## 2021-05-23 NOTE — Progress Notes (Signed)
Virtual Visit via Video Note  I connected with  Alejandro Weber  on 05/23/21 at 11:30 AM EST by a video enabled telemedicine application and verified that I am speaking with the correct person using two identifiers.  Location: Patient: Work Restaurant manager, fast food: Nature conservation officer at [employer] Persons present: Patient and myself   I discussed the limitations of evaluation and management by telemedicine and the availability of in person appointments. The patient expressed understanding and agreed to proceed.   History of Present Illness:  Virtual visit for recheck from 05/09/21:  Patient is in today for recheck on anxiety and depression.   He states that his wife made the appointment for him today and also encouraged him to come in.  It is very hard for him to talk about what he has been feeling, but knows and acknowledges that this is important to discuss that he has not been feeling well.   He states that his mental health is starting to affect his relationships and life.  He is starting to lash out at coworkers and his family.  He has 3 young children and he is happily married.  He says his family is everything to him.  He is upset with himself that he has been feeling so angry.   He takes a lot of work home with him, usually about 50 hours per week. Works as a IT trainer.  States that he has always been highly driven and motivated.  He puts a lot of pressure on himself.  He states that he has been feeling like a failure lately.  His cousin had cystic fibrosis and did end up committing suicide.  Patient states that he would never do this to his family and he has no plan or intentions, but he does empathize with the feeling of just wanting everything to go away and get better.   He has been on and off of Lexapro over the years.  He states that he has been taking 10 mg daily for a while now.  He does not want to be on medication at all, but states that if it is helpful he will continue to take it.    Started snoring badly about one year ago and he states him and his wife have to sleep in separate bedrooms now. He feels tired a lot. Averages about 6.5 hours per night. He has never had a sleep study done.  Says that he is still intimate with his wife and they have a good relationship.  Today, 05/23/21: -Pt has started on Wellbutrin twice daily in addition to Lexapro 10 mg daily. Denies any side effects. States he has been feeling more hopeful. -Scheduled for counseling next month -BP readings have been 130s-140s/90s at home, no symptoms   Observations/Objective:   Gen: Awake, alert, no acute distress Resp: Breathing is even and non-labored Psych: calm/pleasant demeanor Neuro: Alert and Oriented x 3, + facial symmetry, speech is clear.   Assessment and Plan:  1. GAD (generalized anxiety disorder) 2. Moderate episode of recurrent major depressive disorder (HCC) -He is currently taking Lexapro 10 mg and has added Wellbutrin SR 150 mg twice daily.  Denies any side effects.  He has been feeling more hopeful.  He has scheduled counseling next month. -I plan to follow-up with him in the next 4 to 6 weeks.  3. Loud snoring -He is scheduled for a sleep study in February.  4. Elevated blood pressure reading -Blood pressure has been elevated, but this has  started prior to the addition of Wellbutrin.  I encouraged him to keep an eye on things and try to minimize stress levels.  Really push for him to look into the Mediterranean diet and low-salt diet.  He also needs to increase cardiovascular exercise. -Recheck in office in about 4 to 6 weeks   Follow Up Instructions:    I discussed the assessment and treatment plan with the patient. The patient was provided an opportunity to ask questions and all were answered. The patient agreed with the plan and demonstrated an understanding of the instructions.   The patient was advised to call back or seek an in-person evaluation if the symptoms  worsen or if the condition fails to improve as anticipated.  Hooper Petteway M Raejean Swinford, PA-C

## 2021-06-05 ENCOUNTER — Other Ambulatory Visit: Payer: Self-pay

## 2021-06-05 ENCOUNTER — Other Ambulatory Visit: Payer: Self-pay | Admitting: Physician Assistant

## 2021-06-17 ENCOUNTER — Ambulatory Visit (INDEPENDENT_AMBULATORY_CARE_PROVIDER_SITE_OTHER): Payer: 59 | Admitting: Psychologist

## 2021-06-17 ENCOUNTER — Other Ambulatory Visit: Payer: Self-pay

## 2021-06-17 DIAGNOSIS — F411 Generalized anxiety disorder: Secondary | ICD-10-CM | POA: Diagnosis not present

## 2021-06-17 NOTE — Progress Notes (Signed)
[facility] Counselor Initial Adult Exam  Name: Alejandro Weber Date: 06/17/2021 MRN: [id number] DOB: 09-27-1985 PCP: Bary Leriche, PA-C  Time spent: 2:03 pm to 2:27 pm; Total Time: 24 minutes  This session was held via in person. The patient consented to in-person therapy and was in the clinician's office. Limits of confidentiality were discussed with the patient.   Guardian/Payee:  NA    Paperwork requested: No   Reason for Visit /Presenting Problem: Patient endorsed experiencing anxiety based symptoms.   Mental Status Exam: Appearance:   Well Groomed     Behavior:  Appropriate  Motor:  Normal  Speech/Language:   Clear and Coherent  Affect:  Appropriate  Mood:  anxious  Thought process:  normal  Thought content:    WNL  Sensory/Perceptual disturbances:    WNL  Orientation:  oriented to person, place, time/date, situation, and day of week  Attention:  Good  Concentration:  Good  Memory:  WNL  Fund of knowledge:   Good  Insight:    Fair  Judgment:   Fair  Impulse Control:  Good     Reported Symptoms:  The patient endorsed experiencing the following: racing thoughts, feeling on edge, feeling restless, difficulty controlling worries, feeling overwhelmed by worries, tense muscles, heart palpations, and shortness of breath. He denied suicidal and homicidal ideation.   Risk Assessment: Danger to Self:  No Self-injurious Behavior: No Danger to Others: No Duty to Warn:no Physical Aggression / Violence:No  Access to Firearms a concern: No  Gang Involvement:No  Patient / guardian was educated about steps to take if suicide or homicide risk level increases between visits: n/a While future psychiatric events cannot be accurately predicted, the patient does not currently require acute inpatient psychiatric care and does not currently meet [facility] involuntary commitment criteria.  Substance Abuse History: Current substance abuse:  Patient stated that  he consumes up to 10 drinks of alcohol through a week. Per the patient, he indicated that his drinking fluctuates on a day to day basis.      Past Psychiatric History:   Previous psychological history is significant for anxiety Outpatient Providers:Patient has never been in therapy. Patient currently takes Lexapro and Wellbutrin.  History of Psych Hospitalization: No  Psychological Testing:  NA    Abuse History:  Victim of: No.,  NA    Report needed: No. Victim of Neglect:No. Perpetrator of  NA   Witness / Exposure to Domestic Violence: No   Protective Services Involvement: No  Witness to [employer] Violence:  No   Family History:  Family History  Problem Relation Age of Onset   Hyperlipidemia Mother        Father,  MGM   Heart disease Father 91       cad-angioplasty   Hypertension Father        MGM   Esophageal cancer Father    Colon cancer Paternal Grandfather 110   Prostate cancer Maternal Grandfather    Coronary artery disease Maternal Grandfather    Diabetes Paternal Grandmother     Living situation: the patient lives with their family. Specifically, patient lives with his wife, 56 year old son, three year old son, and one year old daughter.   Sexual Orientation: Straight  Relationship Status: married  Name of spouse / other: [name] is Radiation protection practitioner. They have been married for 12 years.  If a parent, number of children / ages: Patient has three children. He has a 51 year old son, a three  year old son, and a one year old daughter.   Support Systems: spouse parents  Financial Stress:  No   Income/Employment/Disability: Employment. 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   Any cultural  differences that may affect / interfere with treatment:  Denied  Recreation/Hobbies: reading  Stressors: Occupational concerns  . Patient also endorsed stressors related to parenting, being a spouse, and caring for his mother.   Strengths: Supportive Relationships, Family, and Friends  Barriers:  Denied   Legal History: Pending legal issue / charges: The patient has no significant history of legal issues. History of legal issue / charges:  NA  Medical History/Surgical History: reviewed Past Medical History:  Diagnosis Date   Anxiety    History of chicken pox    childhood   History of kidney stones 2006    Past Surgical History:  Procedure Laterality Date   NO PAST SURGERIES  12/22/2011   VASECTOMY Bilateral 11/2020    Medications: Current Outpatient Medications  Medication Sig Dispense Refill   buPROPion (ZYBAN) 150 MG 12 hr tablet TAKE 1 TABLET(150 MG) BY MOUTH TWICE DAILY 60 tablet 0   escitalopram (LEXAPRO) 10 MG tablet TAKE 1 TABLET BY MOUTH  DAILY 90 tablet 1   escitalopram (LEXAPRO) 10 MG tablet Take 1 tablet (10 mg total) by mouth daily. 90 tablet 3   fluticasone (FLONASE) 50 MCG/ACT nasal spray Place 2 sprays into both nostrils daily. 16 g 1   levocetirizine (XYZAL) 5 MG tablet Take 1 tablet (5 mg total) by mouth every evening. 30 tablet 1   Multiple Vitamins-Minerals (MENS MULTIVITAMIN PLUS PO) Take by mouth daily.     No current facility-administered medications for this visit.    Allergies  Allergen Reactions   Amoxicillin Hives    Diagnoses:  F41.1 Generalized anxiety disorder  Plan of Care: The patient is a 35 year old Caucasian male who was referred due to experiencing anxiety based symptoms. The patient lives at home with his wife, and three children. The patient meets criteria for a diagnosis of F41.1 generalized anxiety disorder based off of the following: racing thoughts, feeling on edge, feeling restless, difficulty controlling worries, feeling  overwhelmed by worries, tense muscles, heart palpations, and shortness of breath. He denied suicidal and homicidal ideation.   The patient stated that he wanted coping skills.  This psychologist makes the recommendation that the patient participate in at least monthly therapy and depending on availability and severity of symptoms, possibly bi-weekly therapy to experience less distress from symptoms.    Hilbert Corrigan, PsyD

## 2021-06-17 NOTE — Plan of Care (Signed)
Symptoms Excessive worry that is difficult to control occurring more days than not for at least 6 months Motor tension Autonomic hyperactivity (palpitations, shortness of breath, dry mouth, nausea, diarrhea Hypervigilance (Feeling on edge, concentration difficulties, difficulties with sleep, irritability   Goals Reduce overall frequency, intensity, and duration of anxiety Stabilize anxiety level wile increasing ability to function Enhance ability to effectively cope with full variety of stressors Learn and implement coping skills that result in a reduction of anxiety   Objectives Verbalize an understanding of the cognitive, physiological, and behavioral components of anxiety Learning and implement calming skills to reduce overall anxiety Verbalize an understanding of the role that cognitive biases play in excessive irrational worry and persistent anxiety symptoms Identify, challenge, and replace based fearful talk Learn and implement problem solving strategies Identify and engage in pleasant activities Learning and implement personal and interpersonal skills to reduce anxiety and improve interpersonal relationships Learn to accept limitations in life and commit to tolerating, rather than avoiding, unpleasant emotions while accomplishing meaningful goals Identify major life conflicts from the past and present that form the basis for present anxiety Maintain involvement in work, family, and social activities Reestablish a consistent sleep-wake cycle Cooperate with a medical evaluation  Interventions Engage the patient in behavioral activation Use instruction, modeling, and role-playing to build the client's general social, communication, and/or conflict resolution skills Use Acceptance and Commitment Therapy to help client accept uncomfortable realities in order to accomplish value-consistent goals Reinforce the client's insight into the role of his/her past emotional pain and present  anxiety  Support the client in following through with work, family, and social activities Teach and implement sleep hygiene practices  Refer the patient to a physician for a psychotropic medication consultation Monito the clint's psychotropic medication compliance Discuss how anxiety typically involves excessive worry, various bodily expressions of tension, and avoidance of what is threatening that interact to maintain the problem  Teach the patient relaxation skills Assign the patient homework Discuss examples demonstrating that unrealistic worry overestimates the probability of threats and und3restmiates patient's ability  Assist the patient in analyzing his or her worries Help patient understand that avoidance is reinforcing    Problem: Anxiety Disorder CCP Problem  1 Alleviate anxiety based symptoms.  Goal:  Alleviate distress from anxiety based symptoms.  Outcome: Not Progressing Goal: " Reduce overall frequency, intensity, and duration of anxiety Outcome: Not Progressing Goal: " Enhance ability to effectively cope with full variety of stressors Outcome: Not Progressing Goal: " Learn and implement coping skills that result in a reduction of anxiety  Outcome: Not Progressing Goal: STG: Patient will participate in at least 80% of scheduled individual psychotherapy sessions Outcome: Not Progressing Goal: STG: Patient will complete at least 80% of assigned homework Outcome: Not Progressing

## 2021-06-20 ENCOUNTER — Other Ambulatory Visit: Payer: Self-pay

## 2021-06-20 ENCOUNTER — Encounter: Payer: Self-pay | Admitting: Physician Assistant

## 2021-06-20 ENCOUNTER — Ambulatory Visit (INDEPENDENT_AMBULATORY_CARE_PROVIDER_SITE_OTHER): Payer: 59 | Admitting: Physician Assistant

## 2021-06-20 VITALS — BP 150/100 | HR 85 | Temp 98.3°F | Ht 69.0 in | Wt 178.2 lb

## 2021-06-20 DIAGNOSIS — I10 Essential (primary) hypertension: Secondary | ICD-10-CM

## 2021-06-20 DIAGNOSIS — R0683 Snoring: Secondary | ICD-10-CM

## 2021-06-20 DIAGNOSIS — F411 Generalized anxiety disorder: Secondary | ICD-10-CM | POA: Diagnosis not present

## 2021-06-20 DIAGNOSIS — F331 Major depressive disorder, recurrent, moderate: Secondary | ICD-10-CM

## 2021-06-20 MED ORDER — LOSARTAN POTASSIUM 25 MG PO TABS: 25.0000 mg | ORAL_TABLET | Freq: Every day | ORAL | 2 refills | Status: DC

## 2021-06-20 NOTE — Progress Notes (Signed)
Subjective:    Patient ID: Alejandro Weber, male    DOB: 05-Nov-1985, 35 y.o.   MRN: [id number]  Chief Complaint  Patient presents with   Follow-up    HPI Patient is in today for recheck. He feels like he is doing better since starting on the Wellbutrin with the Lexapro. Still juggling work-life balance. He and wife try to walk in the evenings, but also raising three young children / battling recent common viruses. States his blood pressure has been trending in 140s/90s. No symptoms such as CP, SOB, headaches, change in vision, etc. He has sleep study in January - says his snoring is so loud him and wife have had to sleep in different bedrooms.   Past Medical History:  Diagnosis Date   Anxiety    History of chicken pox    childhood   History of kidney stones 2006    Past Surgical History:  Procedure Laterality Date   NO PAST SURGERIES  12/22/2011   VASECTOMY Bilateral 11/2020    Family History  Problem Relation Age of Onset   Hyperlipidemia Mother        Father,  MGM   Heart disease Father 35       cad-angioplasty   Hypertension Father        MGM   Esophageal cancer Father    Colon cancer Paternal Grandfather 60   Prostate cancer Maternal Grandfather    Coronary artery disease Maternal Grandfather    Diabetes Paternal Grandmother     Social History   Tobacco Use   Smoking status: Never   Smokeless tobacco: Never  Vaping Use   Vaping Use: Never used  Substance Use Topics   Alcohol use: No    Alcohol/week: 0.0 standard drinks   Drug use: No     Allergies  Allergen Reactions   Amoxicillin Hives    Review of Systems NEGATIVE UNLESS OTHERWISE INDICATED IN HPI      Objective:     BP (!) 150/100    Pulse 85    Temp 98.3 F (36.8 C)    Ht 5\' 9"  (1.753 m)    Wt 178 lb 3.2 oz (80.8 kg)    SpO2 99%    BMI 26.32 kg/m   Wt Readings from Last 3 Encounters:  06/20/21 178 lb 3.2 oz (80.8 kg)  05/09/21 180 lb 3.2 oz (81.7 kg)  02/15/21 178 lb 3.2 oz (80.8 kg)     BP Readings from Last 3 Encounters:  06/20/21 (!) 150/100  05/09/21 (!) 143/101  02/15/21 121/81     Physical Exam Vitals and nursing note reviewed.  Constitutional:      Appearance: Normal appearance.  HENT:     Head: Normocephalic and atraumatic.     Right Ear: External ear normal.     Left Ear: External ear normal.     Nose: Nose normal.  Cardiovascular:     Rate and Rhythm: Normal rate and regular rhythm.     Pulses: Normal pulses.     Heart sounds: Normal heart sounds. No murmur heard. Pulmonary:     Effort: Pulmonary effort is normal.     Breath sounds: Normal breath sounds.  Skin:    General: Skin is warm and dry.  Neurological:     General: No focal deficit present.     Mental Status: He is alert and oriented to person, place, and time.     Gait: Gait normal.  Psychiatric:  Mood and Affect: Mood normal.        Behavior: Behavior normal.       Assessment & Plan:   Problem List Items Addressed This Visit       Other   GAD (generalized anxiety disorder) - Primary   Other Visit Diagnoses     Moderate episode of recurrent major depressive disorder (HCC)       Loud snoring       Essential hypertension       Relevant Medications   losartan (COZAAR) 25 MG tablet        Meds ordered this encounter  Medications   losartan (COZAAR) 25 MG tablet    Sig: Take 1 tablet (25 mg total) by mouth daily.    Dispense:  30 tablet    Refill:  2    1. GAD (generalized anxiety disorder) 2. Moderate episode of recurrent major depressive disorder (HCC) -Doing better -Continue on Wellbutrin 150 mg BID and Lexapro 10 mg qd -Encouraged lifestyle changes as best he can including increasing exercise and improving nutrition as he has been working on  -F/up in about 3 months  3. Loud snoring -Sleep study in January, ?? If this might be driving #6,2 & 4  4. Essential hypertension -More than 3 elevated readings. Will need to start on Losartan 25 mg  daily. -R/B/A discussed -MyChart with updates    This note was prepared with assistance of Conservation officer, historic buildings. Occasional wrong-word or sound-a-like substitutions may have occurred due to the inherent limitations of voice recognition software.  Time Spent: 37 minutes of total time was spent on the date of the encounter performing the following actions: chart review prior to seeing the patient, obtaining history, performing a medically necessary exam, counseling on the treatment plan, placing orders, and documenting in our EHR.    Miranda Garber M Ariannie Penaloza, PA-C

## 2021-06-20 NOTE — Patient Instructions (Addendum)
Continue on Wellbutrin and Lexapro as this regimen seems to be helping well!   ??Underlying sleep apnea - curious to see what your sleep study shows in January....  In meantime, need to start on Losartan 25 mg daily for BP control.  Work on increasing cardiovascular exercise.   MyChart message me in the next month with update on how you are doing.  Call sooner if any concerns.

## 2021-07-18 ENCOUNTER — Telehealth: Payer: Self-pay

## 2021-07-18 NOTE — Telephone Encounter (Signed)
Patient called in to give update:  States he is doing well on the medication.  States BP is doing great.

## 2021-07-19 NOTE — Telephone Encounter (Signed)
Noted and agreed, thank you for update. 

## 2021-07-22 ENCOUNTER — Other Ambulatory Visit: Payer: Self-pay

## 2021-07-22 ENCOUNTER — Ambulatory Visit (INDEPENDENT_AMBULATORY_CARE_PROVIDER_SITE_OTHER): Payer: 59 | Admitting: Psychologist

## 2021-07-22 DIAGNOSIS — F411 Generalized anxiety disorder: Secondary | ICD-10-CM

## 2021-07-22 NOTE — Progress Notes (Signed)
[facility] Counselor/Therapist Progress Note  Patient ID: Alejandro Weber, MRN: [id number],    Date: 07/22/2021  Time Spent: 1:02 pm to 1:46 pm; total time: 44 minutes  This session was held via in person. The patient consented to in-person therapy and was in the clinician's office. Limits of confidentiality were discussed with the patient.     Treatment Type: Individual Therapy  Reported Symptoms: Anxiety based symptoms.   Mental Status Exam: Appearance:  Well Groomed     Behavior: Appropriate  Motor: Normal  Speech/Language:  Normal Rate  Affect: Appropriate  Mood: normal  Thought process: normal  Thought content:   WNL  Sensory/Perceptual disturbances:   WNL  Orientation: oriented to person, place, and time/date  Attention: Good  Concentration: Good  Memory: WNL  Fund of knowledge:  Good  Insight:   Fair  Judgment:  Good  Impulse Control: Good   Risk Assessment: Danger to Self:  No Self-injurious Behavior: No Danger to Others: No Duty to Warn:no Physical Aggression / Violence:No  Access to Firearms a concern: No  Gang Involvement:No   Subjective: Beginning the session patient reflected on events since the last session. He described himself as slightly better. Patient voiced that he wanted strategies that would help facilitate better functioning. After practicing, discussing and processing mindfulness and defusion, he described himself as better. He also explored the theme of control and what he has lost out on related to the need to be in control He processed thoughts and emotions. He was agreeable to homework and following up. He denied suicidal and homicidal ideation.    Interventions:  Worked on developing a therapeutic relationship with the patient using active listening and reflective statements. Provided emotional support using empathy and validation. Reviewed the treatment plan with the patient. Reviewed events since the intake. Normalized and  validated expressed thoughts and emotions. Provided psychoeducation about mindfulness, mindful moments, mindful videos, calm app, and defusion. Practiced and processed mindfulness and defusion. Praised patient for experiencing less distress. Assisted in problem solving. Explored the theme of control. Used quicksand metaphor to assist the patient gain insight into self. Used socratic questions to assist the patient. Processed what patient has lost out on related to the need to be in control. Normalized and validated expressed thoughts and emotions. Provided empathic statements. Assigned homework. Assessed for suicidal and homicidal ideation.   Homework: Implement mindfulness and defusion. Completed  hand out with living with the enemy.   Next Session: Review homework, possible more coping strategies, and emotional support  Diagnosis: F41.1 generalized anxiety disorder  Plan:   Client Abilities:  Friendly and easy to develop rapport  Client Preferences: ACT and CBT  Client statement of Needs: Coping skills  Treatment Level: Outpatient   Goals Reduce overall frequency, intensity, and duration of anxiety Stabilize anxiety level wile increasing ability to function Enhance ability to effectively cope with full variety of stressors Learn and implement coping skills that result in a reduction of anxiety   Objectives target date for all objectives is 06/17/2022 Verbalize an understanding of the cognitive, physiological, and behavioral components of anxiety Learning and implement calming skills to reduce overall anxiety Verbalize an understanding of the role that cognitive biases play in excessive irrational worry and persistent anxiety symptoms Identify, challenge, and replace based fearful talk Learn and implement problem solving strategies Identify and engage in pleasant activities Learning and implement personal and interpersonal skills to reduce anxiety and improve interpersonal  relationships Learn to accept limitations in life and commit  to tolerating, rather than avoiding, unpleasant emotions while accomplishing meaningful goals Identify major life conflicts from the past and present that form the basis for present anxiety Maintain involvement in work, family, and social activities Reestablish a consistent sleep-wake cycle Cooperate with a medical evaluation  Interventions Engage the patient in behavioral activation Use instruction, modeling, and role-playing to build the client's general social, communication, and/or conflict resolution skills Use Acceptance and Commitment Therapy to help client accept uncomfortable realities in order to accomplish value-consistent goals Reinforce the client's insight into the role of his/her past emotional pain and present anxiety  Support the client in following through with work, family, and social activities Teach and implement sleep hygiene practices  Refer the patient to a physician for a psychotropic medication consultation Monito the clint's psychotropic medication compliance Discuss how anxiety typically involves excessive worry, various bodily expressions of tension, and avoidance of what is threatening that interact to maintain the problem  Teach the patient relaxation skills Assign the patient homework Discuss examples demonstrating that unrealistic worry overestimates the probability of threats and underestimates patient's ability  Assist the patient in analyzing his or her worries Help patient understand that avoidance is reinforcing   The patient and clinician reviewed the treatment plan on 07/22/2021  Hilbert Corrigan, PsyD

## 2021-07-22 NOTE — Progress Notes (Signed)
                Thurmon Mizell, PsyD 

## 2021-07-29 ENCOUNTER — Other Ambulatory Visit: Payer: Self-pay

## 2021-07-29 ENCOUNTER — Ambulatory Visit (INDEPENDENT_AMBULATORY_CARE_PROVIDER_SITE_OTHER): Payer: 59 | Admitting: Neurology

## 2021-07-29 ENCOUNTER — Encounter: Payer: Self-pay | Admitting: Neurology

## 2021-07-29 VITALS — BP 122/84 | HR 77 | Ht 68.0 in | Wt 178.0 lb

## 2021-07-29 DIAGNOSIS — R0683 Snoring: Secondary | ICD-10-CM | POA: Insufficient documentation

## 2021-07-29 DIAGNOSIS — K219 Gastro-esophageal reflux disease without esophagitis: Secondary | ICD-10-CM

## 2021-07-29 DIAGNOSIS — F331 Major depressive disorder, recurrent, moderate: Secondary | ICD-10-CM | POA: Diagnosis not present

## 2021-07-29 DIAGNOSIS — Z8249 Family history of ischemic heart disease and other diseases of the circulatory system: Secondary | ICD-10-CM | POA: Diagnosis not present

## 2021-07-29 NOTE — Progress Notes (Signed)
SLEEP MEDICINE CLINIC    Provider:  Melvyn Novasarmen  Zaidin Blyden, MD  Primary Care Physician:  Allwardt, Crist InfanteAlyssa M, PA-C [address] [place] [address]     Referring Provider: Allwardt, Crist InfanteAlyssa M, Pa-c [address] [place] [id number]          Chief Complaint according to patient   Patient presents with:     New Patient (Initial Visit)     He has been snoring for past 2 yrs. She has witnessed apnea or differences in breathing. Never had a sleep study. Avg about 7 hrs. Wakes up still feeling tired      HISTORY OF PRESENT ILLNESS:  Alejandro Weber is a 36 y.o. Caucasian male patient seen here  first time on 07-29-2021 upon PCP referral on 07/29/2021  for a Sleep Medicine consultation.  Chief concern according to patient :  "My wife is a Engineer, civil (consulting)urse and told me I have apnea, I know I snore. "   I have the pleasure of seeing Alejandro Weber today, a right-handed White or Caucasian male with witnessed apneas and he has a past medical history of Anxiety, History of chicken pox, and History of kidney stones (2006).    Sleep relevant medical history: Nocturia, uncorrected deviated septum, rhinitis.    Family medical /sleep history: father was diagnosed with REM BD, PD, OSA but refused CPAP. He died in his 8374 th year.    Social history: patient has paternal half siblings- much older,  Patient is working as IT trainerCPA and lives in a household with RN spouse,  3 children , 5, 3 and 1 year -old. No Pets, Tobacco use: none.   ETOH use 1-2 drinks, 3 nights a week- Caffeine intake in form of Coffee( 3 cups in AM ) Soda( /) Tea ( /) or energy drinks. Regular exercise:  none .         Sleep habits are as follows: The patient's dinner time is between 6-7 PM. The patient goes to bed at 9-10 PM and continues to sleep for 4 hours, wakes for one bathroom break. He takes antacids, has GERD.  The preferred sleep position is variable - supine and prone, with the support of 1 pillow, on a flat bed.  Dreams are reportedly moderately frequent/vivid.  6.30  AM is the usual rise time. The patient wakes up at 5.30 with an alarm. Snooze button.  He reports not feeling refreshed or restored in AM, with symptoms such as dry mouth. Naps are taken on weekends frequently, lasting from 30 to 120 minutes and are more refreshing than nocturnal sleep.    Review of Systems: Out of a complete 14 system review, the patient complains of only the following symptoms, and all other reviewed systems are negative.:  Fatigue, sleepiness , snoring, fragmented sleep, unrefreshed  sleep. GERD.    How likely are you to doze in the following situations: 0 = not likely, 1 = slight chance, 2 = moderate chance, 3 = high chance   Sitting and Reading? Watching Television? Sitting inactive in a public place (theater or meeting)? As a passenger in a car for an hour without a break? Lying down in the afternoon when circumstances permit? Sitting and talking to someone? Sitting quietly after lunch without alcohol? In a car, while stopped for a few minutes in traffic?   Total = 7/ 24 points   FSS endorsed at 31/ 63 points.   Social History   Socioeconomic History  Marital status: Married    Spouse name: Not on file   Number of children: 3   Years of education: Not on file   Highest education level: Not on file  Occupational History   Occupation: IT trainer  Tobacco Use   Smoking status: Never   Smokeless tobacco: Never  Vaping Use   Vaping Use: Never used  Substance and Sexual Activity   Alcohol use: No    Alcohol/week: 0.0 standard drinks   Drug use: No   Sexual activity: Yes    Partners: Female  Other Topics Concern   Not on file  Social History Narrative   Not on file   Social Determinants of Health   Financial Resource Strain: Not on file  Food Insecurity: Not on file  Transportation Needs: Not on file  Physical Activity: Not on file  Stress: Not on file  Social Connections: Not on file     Family History  Problem Relation Age of Onset   Hyperlipidemia Mother        Father,  MGM   Heart disease Father 14       cad-angioplasty   Hypertension Father        MGM   Esophageal cancer Father    Colon cancer Paternal Grandfather 31   Prostate cancer Maternal Grandfather    Coronary artery disease Maternal Grandfather    Diabetes Paternal Grandmother     Past Medical History:  Diagnosis Date   Anxiety    History of chicken pox    childhood   History of kidney stones 2006    Past Surgical History:  Procedure Laterality Date   NO PAST SURGERIES  12/22/2011   VASECTOMY Bilateral 11/2020     Current Outpatient Medications on File Prior to Visit  Medication Sig Dispense Refill   buPROPion (ZYBAN) 150 MG 12 hr tablet TAKE 1 TABLET(150 MG) BY MOUTH TWICE DAILY 60 tablet 0   escitalopram (LEXAPRO) 10 MG tablet Take 1 tablet (10 mg total) by mouth daily. 90 tablet 3   fluticasone (FLONASE) 50 MCG/ACT nasal spray Place 2 sprays into both nostrils daily. 16 g 1   levocetirizine (XYZAL) 5 MG tablet Take 1 tablet (5 mg total) by mouth every evening. 30 tablet 1   Multiple Vitamins-Minerals (MENS MULTIVITAMIN PLUS PO) Take by mouth daily.     losartan (COZAAR) 25 MG tablet Take 1 tablet (25 mg total) by mouth daily. 30 tablet 2   No current facility-administered medications on file prior to visit.    Allergies  Allergen Reactions   Amoxicillin Hives    Physical exam:  Today's Vitals   07/29/21 0839  BP: 122/84  Pulse: 77  Weight: 178 lb (80.7 kg)  Height: 5\' 8"  (1.727 m)   Body mass index is 27.06 kg/m.   Wt Readings from Last 3 Encounters:  07/29/21 178 lb (80.7 kg)  06/20/21 178 lb 3.2 oz (80.8 kg)  05/09/21 180 lb 3.2 oz (81.7 kg)     Ht Readings from Last 3 Encounters:  07/29/21 5\' 8"  (1.727 m)  06/20/21 5\' 9"  (1.753 m)  05/09/21 5\' 9"  (1.753 m)      General: The patient is awake, alert and appears not in acute distress. The patient is well  groomed. Head: Normocephalic, atraumatic. Neck is supple.  Mallampati 2,  neck circumference:15.5  inches .  Nasal airflow  patent.  Retrognathia is not seen,  facial hair.  Dental status: no braces.  Cardiovascular:  Regular rate and cardiac  rhythm by pulse,  without distended neck veins. Respiratory: Lungs are clear to auscultation.  Skin:  Without evidence of ankle edema, or rash. Trunk: The patient's posture is erect.   Neurologic exam : The patient is awake and alert, oriented to place and time.   Memory subjective described as intact.  Attention span & concentration ability appears normal.  Speech is fluent,  without  dysarthria, dysphonia or aphasia.  Mood and affect are appropriate.   Cranial nerves: no loss of smell or taste reported  Pupils are equal and briskly reactive to light. Funduscopic exam deferred. .  Extraocular movements in vertical and horizontal planes were intact and without nystagmus. No Diplopia. Visual fields by finger perimetry are intact. Hearing was intact to soft voice and finger rubbing.    Facial sensation intact to fine touch.  Facial motor strength is symmetric and tongue and uvula move midline.  Neck ROM : rotation, tilt and flexion extension were normal for age and shoulder shrug was symmetrical.    Motor exam:  Symmetric bulk, tone and ROM.   Normal tone without cog -wheeling, symmetric grip strength .   Sensory:  Fine touch, pinprick and vibration were tested  and  normal.  Proprioception tested in the upper extremities was normal.   Coordination: Rapid alternating movements in the fingers/hands were of normal speed.  The Finger-to-nose maneuver was intact , no changes in penmanship.    Gait and station: Patient could rise unassisted from a seated position, walked without assistive device.  Stance is of normal width/ base and the patient turned with 3 steps.  Toe and heel walk were deferred.  Deep tendon reflexes: in the  upper and lower  extremities are symmetric and intact.  Babinski response was deferred.        After spending a total time of  35  minutes face to face and additional time for physical and neurologic examination, review of laboratory studies,  personal review of imaging studies, reports and results of other testing and review of referral information / records as far as provided in visit, I have established the following assessments:  1)  Mr. Alejandro Weber presents with non refreshing, non restorative sleep, the desire to take daytime naps.  2)  Snoring, witnessed apnea. 3)  He has a seasonal sleep deprivation- tax season.  4)  sleep hygiene is excellent.    My Plan is to proceed with:  1) HST to screen for a sleep apnea.   I would like to thank Allwardt, Crist Infante, PA-C and Allwardt, Crist Infante, Pa-c [address] [place],  [place] [address] for allowing me to meet with and to take care of this pleasant patient.   In short, Alejandro Weber is presenting with suspected OSA. I plan to follow up either personally or through our NP within 2-4 month.   CC: I will share my notes with PCP.  Electronically signed by: Alejandro Novas, MD 07/29/2021 8:44 AM  [facility] Board certified by The [employer] of Sleep Medicine and Diplomate of the [employer] of Sleep Medicine. Board certified In Neurology through the ABPN, Fellow of the [employer] of Neurology. Medical Director of [facility].

## 2021-08-07 ENCOUNTER — Other Ambulatory Visit: Payer: Self-pay

## 2021-08-07 ENCOUNTER — Ambulatory Visit (INDEPENDENT_AMBULATORY_CARE_PROVIDER_SITE_OTHER): Payer: 59 | Admitting: Psychologist

## 2021-08-07 DIAGNOSIS — F411 Generalized anxiety disorder: Secondary | ICD-10-CM | POA: Diagnosis not present

## 2021-08-07 NOTE — Progress Notes (Signed)
                Alejandro Mcgruder, PsyD 

## 2021-08-07 NOTE — Progress Notes (Signed)
[facility] Counselor/Therapist Progress Note  Patient ID: Alejandro Weber, MRN: [id number],    Date: 08/07/2021  Time Spent: 8:07 am to 8:37 am; total time: 30 minutes  This session was held via in person. The patient consented to in-person therapy and was in the clinician's office. Limits of confidentiality were discussed with the patient.     Treatment Type: Individual Therapy  Reported Symptoms: Anxiety based symptoms.   Mental Status Exam: Appearance:  Well Groomed     Behavior: Appropriate  Motor: Normal  Speech/Language:  Normal Rate  Affect: Appropriate  Mood: normal  Thought process: normal  Thought content:   WNL  Sensory/Perceptual disturbances:   WNL  Orientation: oriented to person, place, and time/date  Attention: Good  Concentration: Good  Memory: WNL  Fund of knowledge:  Good  Insight:   Fair  Judgment:  Good  Impulse Control: Good   Risk Assessment: Danger to Self:  No Self-injurious Behavior: No Danger to Others: No Duty to Warn:no Physical Aggression / Violence:No  Access to Firearms a concern: No  Gang Involvement:No   Subjective: Beginning the session patient described himself as doing slightly better. Elaborating, he voiced that mindfulness has assisted him as well as moving towards a direction of being more vulnerable at work. He also talked about how he is working on setting an example at home and at work for how he wants to function. He processed through the different emotions and thoughts he was experiencing in relation to the concept of vulnerability. He also briefly talked about his values and using values to assist him. He was agreeable to homework and following up. He denied suicidal and homicidal ideation.    Interventions:  Worked on developing a therapeutic relationship with the patient using active listening and reflective statements. Provided emotional support using empathy and validation. Reviewed events since the last  session. Praised patient for doing better. Normalized and validated patient's experience with mindfulness. Processed expressed thoughts and emotions. Used socratic questions to assist the patient gain insight into self. Challenged some of the different thoughts expressed. Explored themes of what it means to be the best and vulnerability. Processed what type of example patient wants to be to his children. Briefly processed values and what values are important to him. Provided psychoeducation about vulnerability and explored this concept with the patient. Provided empathic statements. Assigned homework. Assessed for suicidal and homicidal ideation.   Homework: Watch the power of vulnerability; reflect on values from handouts provided.   Next Session: Review homework, and emotional support. Possible use of bull's eye target  Diagnosis: F41.1 generalized anxiety disorder  Plan:   Client Abilities:  Friendly and easy to develop rapport  Client Preferences: ACT and CBT  Client statement of Needs: Coping skills  Treatment Level: Outpatient   Goals Reduce overall frequency, intensity, and duration of anxiety Stabilize anxiety level wile increasing ability to function Enhance ability to effectively cope with full variety of stressors Learn and implement coping skills that result in a reduction of anxiety   Objectives target date for all objectives is 06/17/2022 Verbalize an understanding of the cognitive, physiological, and behavioral components of anxiety Learning and implement calming skills to reduce overall anxiety Verbalize an understanding of the role that cognitive biases play in excessive irrational worry and persistent anxiety symptoms Identify, challenge, and replace based fearful talk Learn and implement problem solving strategies Identify and engage in pleasant activities Learning and implement personal and interpersonal skills to reduce anxiety and improve interpersonal  relationships Learn to accept limitations in life and commit to tolerating, rather than avoiding, unpleasant emotions while accomplishing meaningful goals Identify major life conflicts from the past and present that form the basis for present anxiety Maintain involvement in work, family, and social activities Reestablish a consistent sleep-wake cycle Cooperate with a medical evaluation  Interventions Engage the patient in behavioral activation Use instruction, modeling, and role-playing to build the client's general social, communication, and/or conflict resolution skills Use Acceptance and Commitment Therapy to help client accept uncomfortable realities in order to accomplish value-consistent goals Reinforce the client's insight into the role of his/her past emotional pain and present anxiety  Support the client in following through with work, family, and social activities Teach and implement sleep hygiene practices  Refer the patient to a physician for a psychotropic medication consultation Monito the clint's psychotropic medication compliance Discuss how anxiety typically involves excessive worry, various bodily expressions of tension, and avoidance of what is threatening that interact to maintain the problem  Teach the patient relaxation skills Assign the patient homework Discuss examples demonstrating that unrealistic worry overestimates the probability of threats and underestimates patient's ability  Assist the patient in analyzing his or her worries Help patient understand that avoidance is reinforcing   The patient and clinician reviewed the treatment plan on 07/22/2021  Hilbert Corrigan, PsyD

## 2021-08-12 ENCOUNTER — Ambulatory Visit (INDEPENDENT_AMBULATORY_CARE_PROVIDER_SITE_OTHER): Payer: 59 | Admitting: Neurology

## 2021-08-12 DIAGNOSIS — Z8249 Family history of ischemic heart disease and other diseases of the circulatory system: Secondary | ICD-10-CM

## 2021-08-12 DIAGNOSIS — R0683 Snoring: Secondary | ICD-10-CM

## 2021-08-12 DIAGNOSIS — G4733 Obstructive sleep apnea (adult) (pediatric): Secondary | ICD-10-CM

## 2021-08-12 DIAGNOSIS — K219 Gastro-esophageal reflux disease without esophagitis: Secondary | ICD-10-CM

## 2021-08-13 NOTE — Progress Notes (Signed)
[facility]   HOME SLEEP TEST REPORT ( by Watch PAT)   STUDY DATE:  08-13-2021   ORDERING CLINICIAN: Melvyn Novas, MD  REFERRING CLINICIAN:  Allwardt, Crist Infante, Pa-c [address] [place],  [place] [address]    CLINICAL INFORMATION/HISTORY: 07-29-2021 , seen upon PCP referral on 07/29/2021  for a Sleep Medicine Consultation.  Chief concern :  "My wife is a Engineer, civil (consulting) and told me I have apnea, and I know that I snore. "  Alejandro Weber presents with witnessed apneas and he has a past medical history of Anxiety, History of chicken pox, and History of kidney stones (2006). Sleep relevant medical history: Nocturia, uncorrected deviated septum, rhinitis.  Epworth sleepiness score:7 /24.   BMI: 27 kg/m   Neck Circumference: 16"   FINDINGS:   Sleep Summary:   Total Recording Time (hours, min): Total recording time amounted to 8 hours and 24 minutes of which 7 hours and 40 minutes with a calculated total sleep time.  REM sleep was recorded over 21.1% of total sleep time.                                  Respiratory Indices:   Calculated pAHI (per hour): 15.9/h with a REM sleep AHI of 24.3/h and in non-REM REM sleep AHI of 13.6/h.                            Positional AHI:    Supine sleep position was associated with an AHI of 24.2, prone sleep with an AHI of 18.4/h and sleep on the right and left side with AHI's of 5.4 and 6.2/h respectively.  The snoring level reached a mean volume of 41 dB ( moderate)  but was present for the 45% of total sleep time, which is significant.                                               Oxygen Saturation Statistics:       O2 Saturation Range (%):   Oxygen nadir of 89% and up to a maximum saturation of 99% with a mean oxygen saturation of 95%.                                    O2 Saturation (minutes) <89%: 0 minutes          Pulse Rate Statistics:            Pulse Range:   Between 51 and 108 bpm with a mean heart rate of 68 bpm.    Please note that this home sleep test can only provide data of pulse frequency but not cardiac rhythm.              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  There was no hypoxia noted there is normal heart rate variability seen and unless the patient has significant apprehension I would recommend to start auto titration CPAP.   RECOMMENDATION: Auto titration ResMed CPAP with a pressure setting between 6 and 16 cm water pressure, 2 cm EPR,  heated humidification and mask of patient's choice -please note his full facial hair I think he may do best with a nasal pillow or nasal cradle.  Plan B: If Alejandro Weber is strongly objected to a trial of CPAP he could still achieve improvement of his apnea and a reduction in apnea to under 10 AHI per hour by using a dental device.   Dental devices usually do not correct REM sleep dependent apnea but for this patient 80% of total sleep time are associated with a non-REM sleep AHI of 13.6/h which could be responsive.    INTERPRETING PHYSICIAN:   Melvyn Novas, MD   Medical Director of [facility] at [facility].

## 2021-08-28 ENCOUNTER — Telehealth: Payer: Self-pay | Admitting: Physician Assistant

## 2021-08-28 ENCOUNTER — Other Ambulatory Visit: Payer: Self-pay

## 2021-08-28 MED ORDER — BUPROPION HCL ER (SMOKING DET) 150 MG PO TB12: ORAL_TABLET | ORAL | 1 refills | Status: DC

## 2021-08-28 NOTE — Telephone Encounter (Signed)
Rx sent in

## 2021-08-28 NOTE — Telephone Encounter (Signed)
.. °  Encourage patient to contact the pharmacy for refills or they can request refills through [facility]  LAST APPOINTMENT DATE:  06/20/21  NEXT APPOINTMENT DATE: 09/20/21  MEDICATION:buPROPion (ZYBAN) 150 MG 12 hr tablet  Is the patient out of medication? yes  PHARMACY:  [id number] - [place], [place] - [address] Phone:  [phone]  Fax:  [phone]      Let patient know to contact pharmacy at the end of the day to make sure medication is ready.  Please notify patient to allow 48-72 hours to process

## 2021-08-30 ENCOUNTER — Other Ambulatory Visit: Payer: Self-pay

## 2021-08-30 ENCOUNTER — Ambulatory Visit (INDEPENDENT_AMBULATORY_CARE_PROVIDER_SITE_OTHER): Payer: 59 | Admitting: Psychologist

## 2021-08-30 DIAGNOSIS — G4733 Obstructive sleep apnea (adult) (pediatric): Secondary | ICD-10-CM | POA: Insufficient documentation

## 2021-08-30 DIAGNOSIS — F411 Generalized anxiety disorder: Secondary | ICD-10-CM

## 2021-08-30 IMAGING — US US SCROTUM W/ DOPPLER COMPLETE
1 series · 14 of 25 positions shown · non-contrast
Comparison: None.

CLINICAL DATA: Left testicle pain

EXAM:
SCROTAL ULTRASOUND
DOPPLER ULTRASOUND OF THE TESTICLES
TECHNIQUE: Complete ultrasound examination of the testicles, epididymis, and
other scrotal structures was performed. Color and spectral Doppler
ultrasound were also utilized to evaluate blood flow to the
testicles.

[Series 1: us scrotum w/ doppler complete · 0.06mm/px · 14 of 63 slices shown]
[im 1/63]
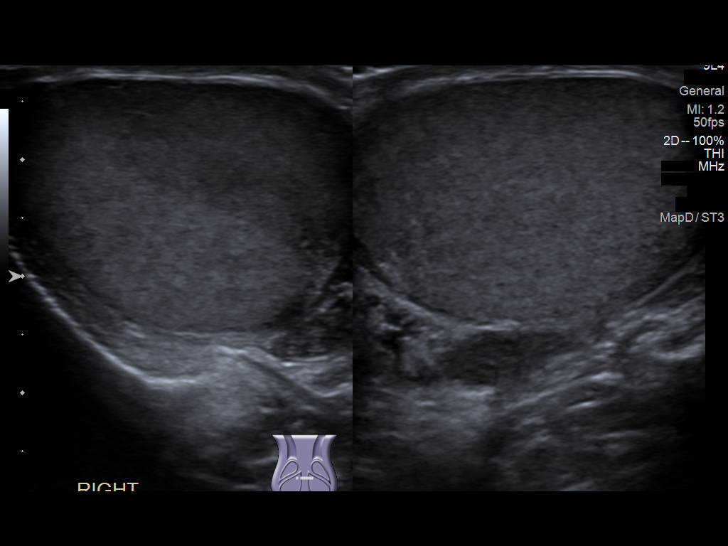
[im 6/63]
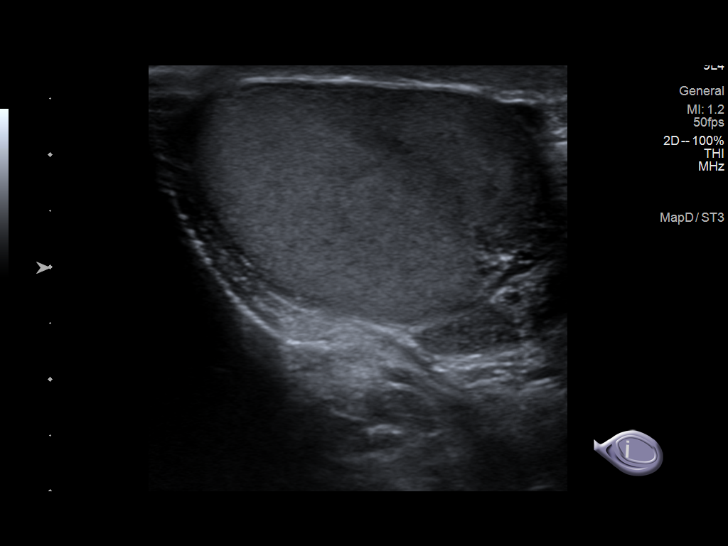
[im 11/63]
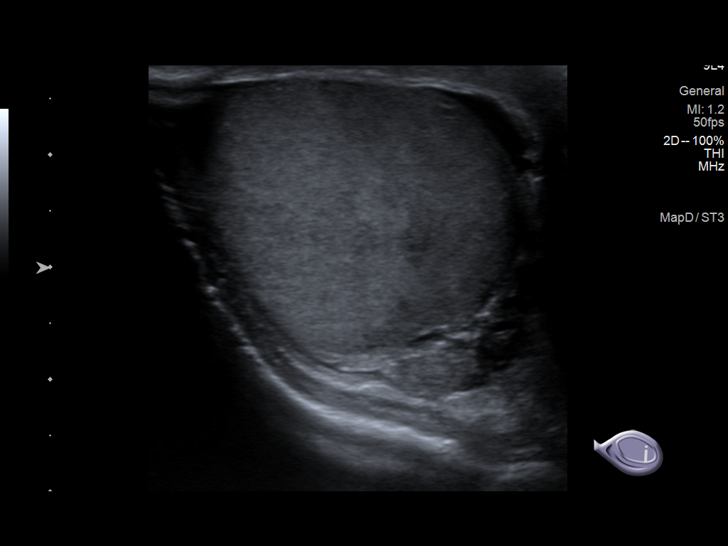
[im 16/63]
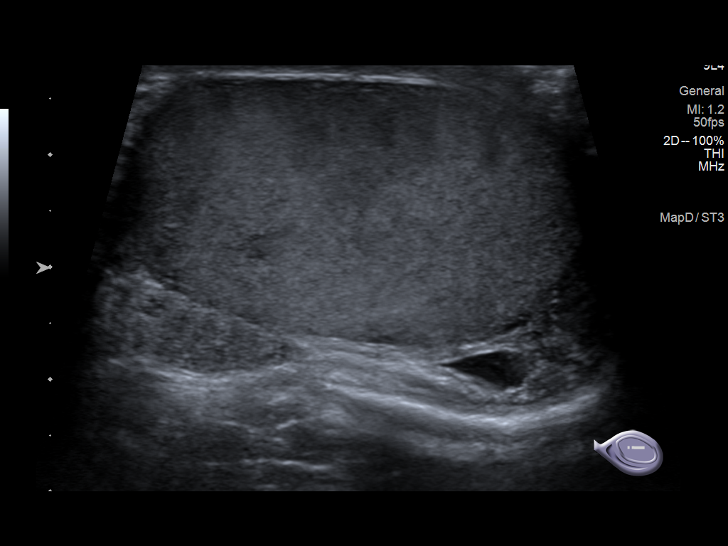
[im 21/63]
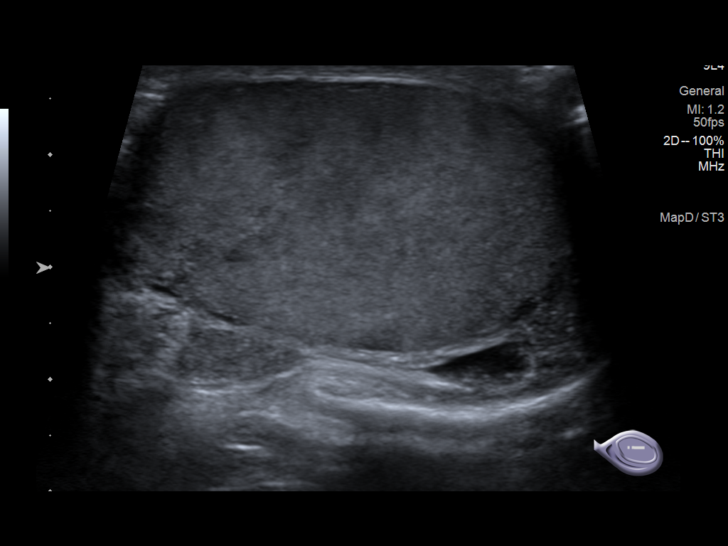
[im 24/63]
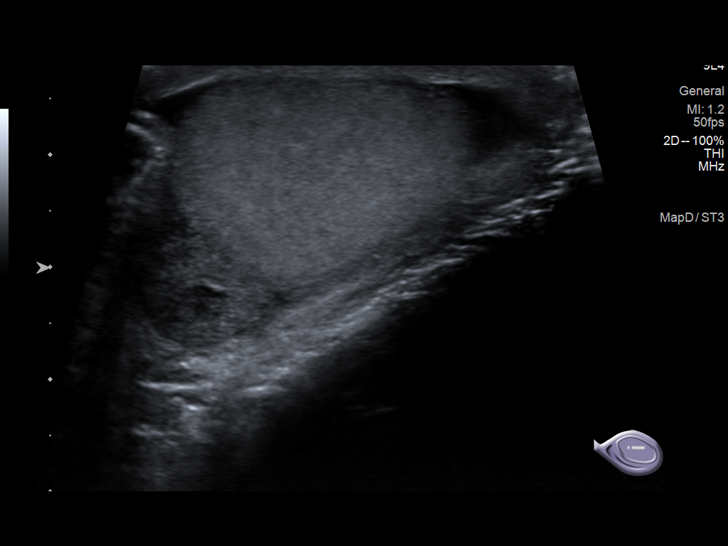
[im 29/63]
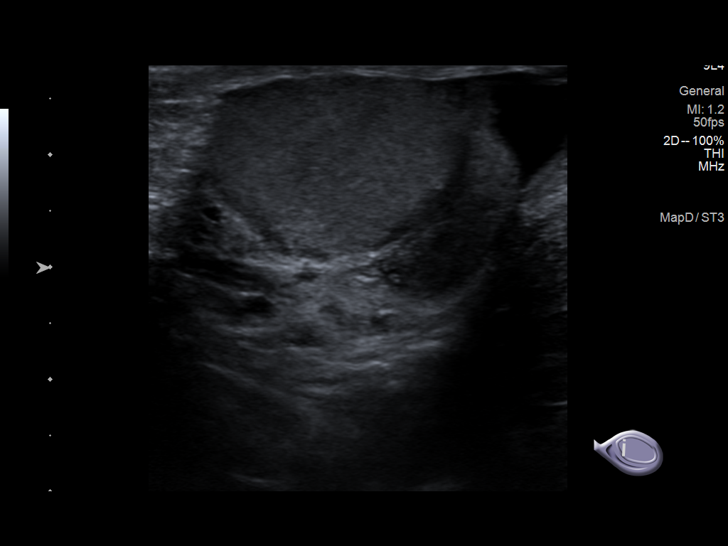
[im 34/63]
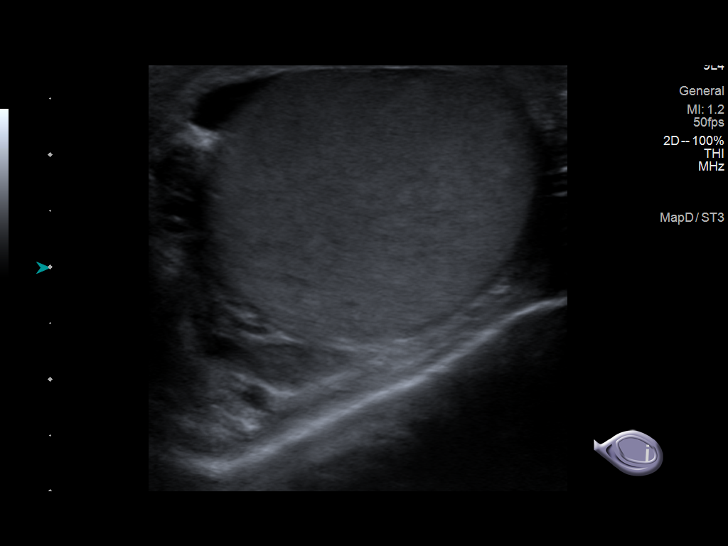
[im 39/63]
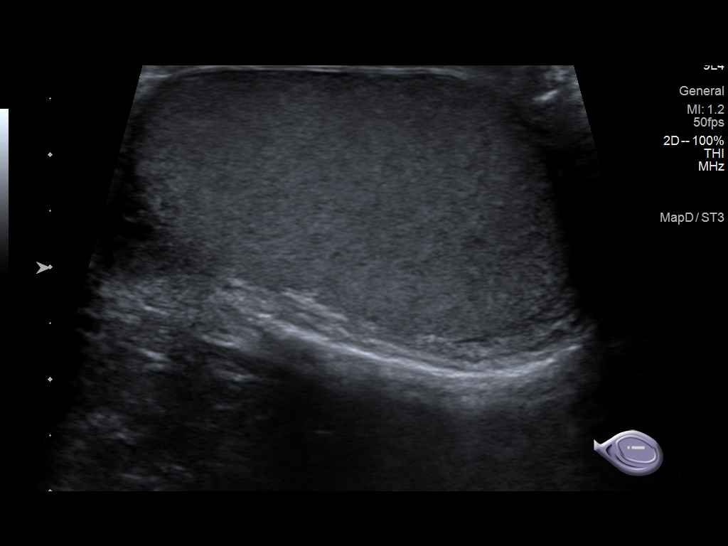
[im 42/63]
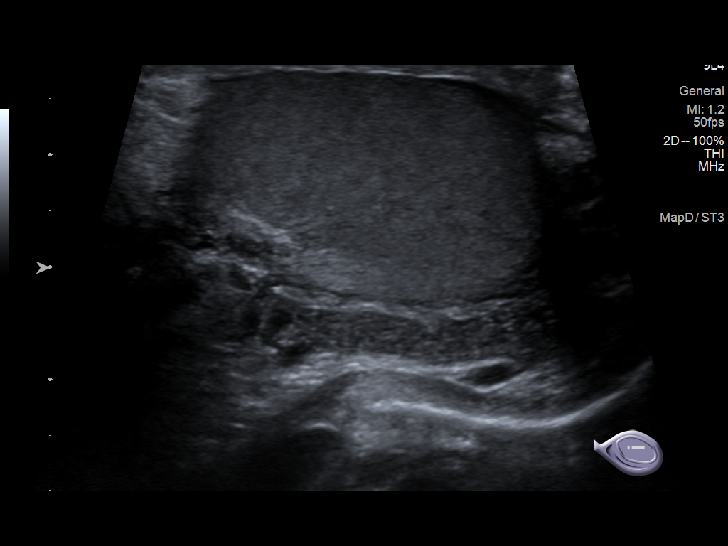
[im 47/63]
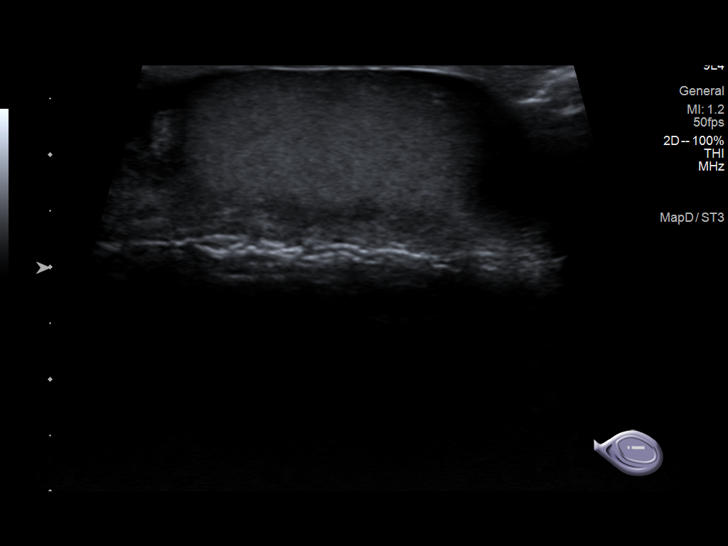
[im 52/63]
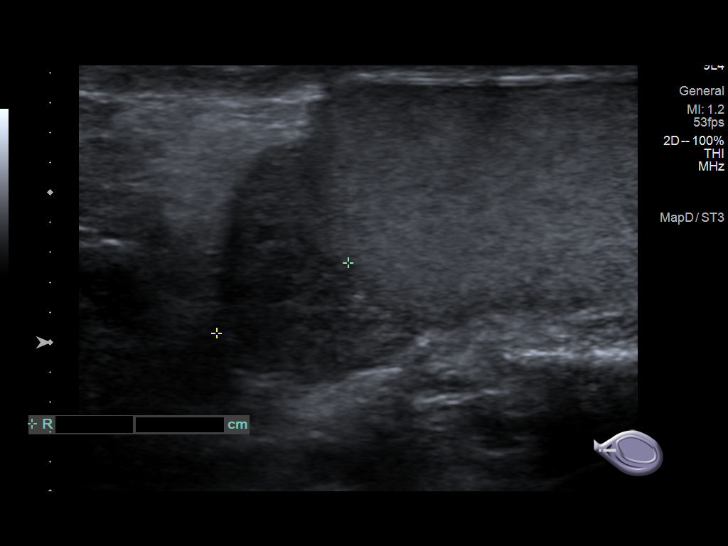
[im 57/63]
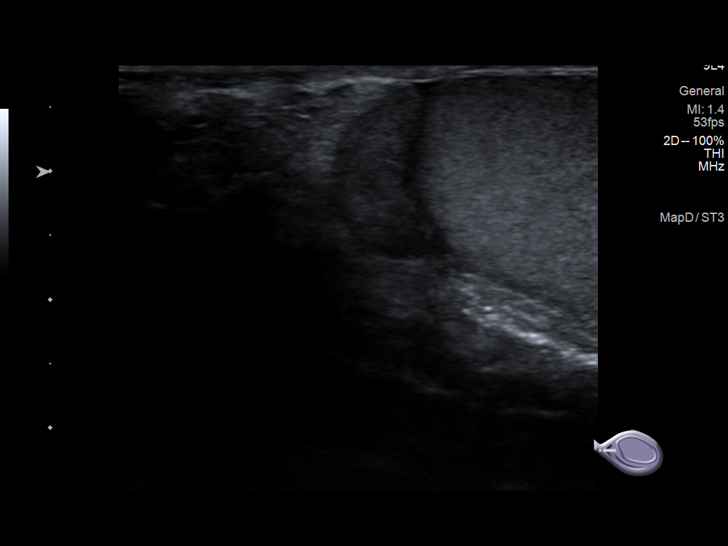
[im 63/63]
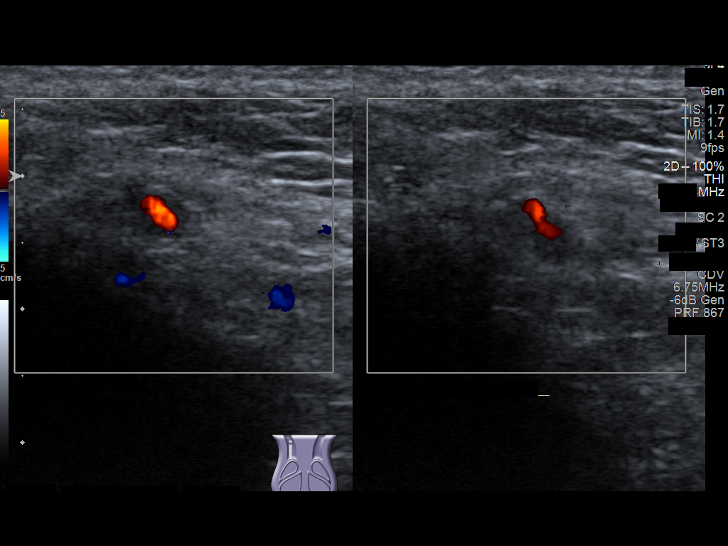

[14 of 25 positions shown; findings below may reference images not displayed]

FINDINGS: Right testicle

Measurements: 4.1 x 2.4 x 3 cm. No mass or microlithiasis
visualized.

Left testicle

Measurements: 4.1 x 2.3 x 3.1 cm. No mass or microlithiasis
visualized.

Right epididymis:  Normal in size and appearance.

Left epididymis:  Normal in size and appearance.

Hydrocele:  Small left greater than right hydrocele.

Varicocele:  None visualized.

Pulsed Doppler interrogation of both testes demonstrates normal low
resistance arterial and venous waveforms bilaterally.
IMPRESSION: 1. Negative for testicular torsion or testicular mass.
2. Small left greater than right hydroceles

## 2021-08-30 NOTE — Procedures (Signed)
° °  °  °[facility] °  °HOME SLEEP TEST REPORT ( by Watch PAT)   °STUDY DATE:  08-13-2021 °  °ORDERING CLINICIAN: Kymberley Raz, MD  °REFERRING CLINICIAN:  Allwardt, Alyssa M, Pa-c °[address] °[place],   [address]  °  °CLINICAL INFORMATION/HISTORY: 07-29-2021 , seen upon PCP referral on 07/29/2021  for a Sleep Medicine Consultation.  °Chief concern :  "My wife is a Nurse and told me I have apnea, and I know that I snore. " ° Alejandro Weber presents with witnessed apneas and he has a past medical history of Anxiety, History of chicken pox, and History of kidney stones (2006). Sleep relevant medical history: Nocturia, uncorrected deviated septum, rhinitis. ° °Epworth sleepiness score:7 /24. °  °BMI: 27 kg/m² °  °Neck Circumference: 16" °  °FINDINGS: °  °Sleep Summary: °  °Total Recording Time (hours, min): Total recording time amounted to 8 hours and 24 minutes of which 7 hours and 40 minutes with a calculated total sleep time.  REM sleep was recorded over 21.1% of total sleep time.                                °  °Respiratory Indices: °  °Calculated pAHI (per hour): 15.9/h with a REM sleep AHI of 24.3/h and in non-REM REM sleep AHI of 13.6/h.                          °  °Positional AHI:    Supine sleep position was associated with an AHI of 24.2, prone sleep with an AHI of 18.4/h and sleep on the right and left side with AHI's of 5.4 and 6.2/h respectively. ° °The snoring level reached a mean volume of 41 dB ( moderate)  but was present for the 45% of total sleep time, which is significant.                                             °  °Oxygen Saturation Statistics:     °  °O2 Saturation Range (%):   Oxygen nadir of 89% and up to a maximum saturation of 99% with a mean oxygen saturation of 95%.                                  °  °O2 Saturation (minutes) <89%: 0 minutes        °  °Pulse Rate Statistics:          °  °Pulse Range:   Between 51 and 108 bpm with a mean heart rate of 68 bpm.  °  Please note that this home sleep test can only provide data of pulse frequency but not cardiac rhythm.            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  There was no hypoxia noted there is normal heart rate variability seen and unless the patient has significant apprehension I would recommend to start auto titration CPAP. °  °RECOMMENDATION: Auto titration ResMed CPAP with a pressure setting between 6 and 16 cm water pressure, 2 cm EPR,   heated humidification and mask of patient's choice -please note his full facial hair I think he may do best with a nasal pillow or nasal cradle. ° °Plan B: If Mr. Bell is strongly objected to a trial of CPAP he could still achieve improvement of his apnea and a reduction in apnea to under 10 AHI per hour by using a dental device.   °Dental devices usually do not correct REM sleep dependent apnea but for this patient 80% of total sleep time are associated with a non-REM sleep AHI of 13.6/h which could be responsive. ° °  °INTERPRETING PHYSICIAN: ° ° Sativa Gelles, MD  ° °Medical Director of [facility].  ° ° ° ° ° ° ° ° ° ° ° ° ° ° ° ° ° ° ° ° ° °

## 2021-08-30 NOTE — Addendum Note (Signed)
Addended by: Melvyn Novas on: 08/30/2021 02:17 PM   Modules accepted: Orders

## 2021-08-30 NOTE — Progress Notes (Signed)
[facility] Counselor/Therapist Progress Note  Patient ID: Alejandro Weber, MRN: [id number],    Date: 08/30/2021  Time Spent: 8:05 am to 8:46 am; total time: 41 minutes  This session was held via in person. The patient consented to in-person therapy and was in the clinician's office. Limits of confidentiality were discussed with the patient.     Treatment Type: Individual Therapy  Reported Symptoms: Less anxiety   Mental Status Exam: Appearance:  Well Groomed     Behavior: Appropriate  Motor: Normal  Speech/Language:  Normal Rate  Affect: Appropriate  Mood: normal  Thought process: normal  Thought content:   WNL  Sensory/Perceptual disturbances:   WNL  Orientation: oriented to person, place, and time/date  Attention: Good  Concentration: Good  Memory: WNL  Fund of knowledge:  Good  Insight:   Fair  Judgment:  Good  Impulse Control: Good   Risk Assessment: Danger to Self:  No Self-injurious Behavior: No Danger to Others: No Duty to Warn:no Physical Aggression / Violence:No  Access to Firearms a concern: No  Gang Involvement:No   Subjective: Beginning the session patient described himself as better while reflecting on events since the last session. The patient indicated that the Felicity Pellegrini Talk was powerful and spent time processing the concept of vulnerability. From there, he spent time reflecting on thoughts that bother him. Elaborating, he identified thoughts of expectations of self and others. He processed thoughts and emotions about this concept. Through the conversation themes of mentorship, leadership, perfectionism, and values were discussed.  He was agreeable to homework and following up. He denied suicidal and homicidal ideation.    Interventions:  Worked on developing a therapeutic relationship with the patient using active listening and reflective statements. Provided emotional support using empathy and validation. Used summary statements. Processed events  since the last session. Praised patient for doing better and explored what has assisted the patient. Processed the Ted Talk video and how it aligns with the patient. Processed thoughts and emotions. Used socratic questions to assist the patient gain insight into self. Used analogies and metaphors to assist the patient. Challenged some of the thoughts expressed. Identified several themes including mentorship, leadership, perfectionism, and values. Provided psychoeducation about expectations of others and explored this concept. Provided psychoeducation about being grounded, the Art of Groundedness, and values. Provided empathic statements. Assigned homework. Assessed for suicidal and homicidal ideation.   Homework: Read the Practice of Groundedness, possibly watch podcast, complete handout on values.   Next Session: Review homework, discuss letting go of expectations of others, and emotional support.   Diagnosis: F41.1 generalized anxiety disorder  Plan:   Client Abilities:  Friendly and easy to develop rapport  Client Preferences: ACT and CBT  Client statement of Needs: Coping skills  Treatment Level: Outpatient   Goals Reduce overall frequency, intensity, and duration of anxiety Stabilize anxiety level wile increasing ability to function Enhance ability to effectively cope with full variety of stressors Learn and implement coping skills that result in a reduction of anxiety   Objectives target date for all objectives is 06/17/2022 Verbalize an understanding of the cognitive, physiological, and behavioral components of anxiety Learning and implement calming skills to reduce overall anxiety Verbalize an understanding of the role that cognitive biases play in excessive irrational worry and persistent anxiety symptoms Identify, challenge, and replace based fearful talk Learn and implement problem solving strategies Identify and engage in pleasant activities Learning and implement personal  and interpersonal skills to reduce anxiety and improve interpersonal relationships  Learn to accept limitations in life and commit to tolerating, rather than avoiding, unpleasant emotions while accomplishing meaningful goals Identify major life conflicts from the past and present that form the basis for present anxiety Maintain involvement in work, family, and social activities Reestablish a consistent sleep-wake cycle Cooperate with a medical evaluation  Interventions Engage the patient in behavioral activation Use instruction, modeling, and role-playing to build the client's general social, communication, and/or conflict resolution skills Use Acceptance and Commitment Therapy to help client accept uncomfortable realities in order to accomplish value-consistent goals Reinforce the client's insight into the role of his/her past emotional pain and present anxiety  Support the client in following through with work, family, and social activities Teach and implement sleep hygiene practices  Refer the patient to a physician for a psychotropic medication consultation Monito the clint's psychotropic medication compliance Discuss how anxiety typically involves excessive worry, various bodily expressions of tension, and avoidance of what is threatening that interact to maintain the problem  Teach the patient relaxation skills Assign the patient homework Discuss examples demonstrating that unrealistic worry overestimates the probability of threats and underestimates patient's ability  Assist the patient in analyzing his or her worries Help patient understand that avoidance is reinforcing   The patient and clinician reviewed the treatment plan on 07/22/2021  Hilbert Corrigan, PsyD                    Hilbert Corrigan, PsyD

## 2021-09-02 ENCOUNTER — Telehealth: Payer: Self-pay | Admitting: Neurology

## 2021-09-02 ENCOUNTER — Encounter: Payer: Self-pay | Admitting: Neurology

## 2021-09-02 NOTE — Telephone Encounter (Signed)
I called pt. I advised pt that Dr. Brett Fairy reviewed their sleep study results and found that pt has mild to moderate sleep apnea. Dr. Brett Fairy recommends that pt starts auto CPAP. I reviewed PAP compliance expectations with the pt. Pt is agreeable to starting a CPAP. I advised pt that an order will be sent to a DME, [facility] , and [facility] will call the pt within about one week after they file with the pt's insurance. [facility] will show the pt how to use the machine, fit for masks, and troubleshoot the CPAP if needed. A follow up appt was made for insurance purposes with Debbora Presto, NP on May 15,2023 at 9 am. Pt verbalized understanding to arrive 15 minutes early and bring their CPAP. A letter with all of this information in it will be mailed to the pt as a reminder. I verified with the pt that the address we have on file is correct. Pt verbalized understanding of results. Pt had no questions at this time but was encouraged to call back if questions arise. I have sent the order to [facility] and have received confirmation that they have received the order.

## 2021-09-02 NOTE — Telephone Encounter (Signed)
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  There was no hypoxia noted, there was a normal heart rate variability seen, and unless the patient has significant apprehension I would recommend to start auto -titration CPAP.  RECOMMENDATION: Auto titration ResMed CPAP with a pressure setting between 6 and 16 cm water pressure, 2 cm EPR, heated humidification and mask of patient's choice -please note his full facial hair .He may do best with a nasal pillow or nasal cradle.  Plan B: If Mr. Bamber strongly objects to a trial of CPAP, he could still achieve improvement of his apnea and a reduction in apnea to an AHI below 10 per hour by using a dental device.   Dental devices usually do not correct REM sleep dependent apnea but for this patient 80% of total sleep time are associated with a non-REM sleep AHI of 13.6/h which could be responsive.

## 2021-09-12 ENCOUNTER — Other Ambulatory Visit: Payer: Self-pay | Admitting: Physician Assistant

## 2021-09-20 ENCOUNTER — Ambulatory Visit (INDEPENDENT_AMBULATORY_CARE_PROVIDER_SITE_OTHER): Payer: 59 | Admitting: Physician Assistant

## 2021-09-20 ENCOUNTER — Encounter: Payer: Self-pay | Admitting: Physician Assistant

## 2021-09-20 VITALS — BP 130/84 | HR 88 | Temp 98.0°F | Ht 68.0 in

## 2021-09-20 DIAGNOSIS — I10 Essential (primary) hypertension: Secondary | ICD-10-CM

## 2021-09-20 DIAGNOSIS — F331 Major depressive disorder, recurrent, moderate: Secondary | ICD-10-CM

## 2021-09-20 DIAGNOSIS — G4733 Obstructive sleep apnea (adult) (pediatric): Secondary | ICD-10-CM | POA: Diagnosis not present

## 2021-09-20 DIAGNOSIS — F411 Generalized anxiety disorder: Secondary | ICD-10-CM | POA: Diagnosis not present

## 2021-09-20 MED ORDER — LOSARTAN POTASSIUM 25 MG PO TABS
ORAL_TABLET | ORAL | 1 refills | Status: DC
Start: 2021-09-20 — End: 2022-03-05

## 2021-09-20 MED ORDER — BUPROPION HCL ER (SMOKING DET) 150 MG PO TB12: ORAL_TABLET | ORAL | 1 refills | Status: DC

## 2021-09-20 NOTE — Progress Notes (Signed)
? ?Subjective:  ? ? Patient ID: Alejandro Weber, male    DOB: 27-Nov-1985, 36 y.o.   MRN: [id number] ? ?Chief Complaint  ?Patient presents with  ? Anxiety  ? ? ?Anxiety ? ? ? ?Patient is in today for follow-up.  Patient states that overall he has been doing well and feeling better.  He was diagnosed with mild to moderate sleep apnea since her last visit and he is working on getting used to the CPAP at night.  He has had it for about a week now and he does think it is going to help with his sleep and energy levels.  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  He thinks the Wellbutrin and Lexapro have worked really well for him.  He has not been checking his blood pressure at home, but does not feel like it has been running high like it was previously.  No new concerns. ? ?Past Medical History:  ?Diagnosis Date  ? Anxiety   ? History of chicken pox   ? childhood  ? History of kidney stones 2006  ? ? ?Past Surgical History:  ?Procedure Laterality Date  ? NO PAST SURGERIES  12/22/2011  ? VASECTOMY Bilateral 11/2020  ? ? ?Family History  ?Problem Relation Age of Onset  ? Hyperlipidemia Mother   ?     Father,  MGM  ? Heart disease Father 5  ?     cad-angioplasty  ? Hypertension Father   ?     MGM  ? Esophageal cancer Father   ? Colon cancer Paternal Grandfather 6  ? Prostate cancer Maternal Grandfather   ? Coronary artery disease Maternal Grandfather   ? Diabetes Paternal Grandmother   ? ? ?Social History  ? ?Tobacco Use  ? Smoking status: Never  ? Smokeless tobacco: Never  ?Vaping Use  ? Vaping Use: Never used  ?Substance Use Topics  ? Alcohol use: No  ?  Alcohol/week: 0.0 standard drinks  ? Drug use: No  ?  ? ?Allergies  ?Allergen Reactions  ? Amoxicillin Hives  ? ? ?Review of Systems ?NEGATIVE UNLESS OTHERWISE INDICATED IN HPI ? ? ?   ?Objective:  ?  ? ?BP 130/84   Pulse 88   Temp 98 ?F (36.7 ?C)   Ht 5\' 8"  (1.727 m)   SpO2 99%   BMI 27.06 kg/m?  ? ?Wt  Readings from Last 3 Encounters:  ?07/29/21 178 lb (80.7 kg)  ?06/20/21 178 lb 3.2 oz (80.8 kg)  ?05/09/21 180 lb 3.2 oz (81.7 kg)  ? ? ?BP Readings from Last 3 Encounters:  ?09/20/21 130/84  ?07/29/21 122/84  ?06/20/21 (!) 150/100  ?  ? ?Physical Exam ?Vitals and nursing note reviewed.  ?Constitutional:   ?   Appearance: Normal appearance.  ?HENT:  ?   Head: Normocephalic and atraumatic.  ?   Right Ear: External ear normal.  ?   Left Ear: External ear normal.  ?   Nose: Nose normal.  ?Cardiovascular:  ?   Rate and Rhythm: Normal rate and regular rhythm.  ?   Pulses: Normal pulses.  ?   Heart sounds: Normal heart sounds. No murmur heard. ?Pulmonary:  ?   Effort: Pulmonary effort is normal.  ?   Breath sounds: Normal breath sounds.  ?Skin: ?   General: Skin is warm and dry.  ?Neurological:  ?   General: No focal deficit present.  ?  Mental Status: He is alert and oriented to person, place, and time.  ?   Gait: Gait normal.  ?Psychiatric:     ?   Mood and Affect: Mood normal.     ?   Behavior: Behavior normal.  ? ? ?   ?Assessment & Plan:  ? ?Problem List Items Addressed This Visit   ? ?  ? Cardiovascular and Mediastinum  ? Essential hypertension  ? Relevant Medications  ? losartan (COZAAR) 25 MG tablet  ?  ? Respiratory  ? Mild obstructive sleep apnea-hypopnea syndrome  ?  ? Other  ? GAD (generalized anxiety disorder) - Primary  ? Moderate episode of recurrent major depressive disorder (HCC)  ? ? ? ?Meds ordered this encounter  ?Medications  ? buPROPion (ZYBAN) 150 MG 12 hr tablet  ?  Sig: TAKE 1 TABLET(150 MG) BY MOUTH TWICE DAILY  ?  Dispense:  180 tablet  ?  Refill:  1  ? losartan (COZAAR) 25 MG tablet  ?  Sig: TAKE 1 TABLET(25 MG) BY MOUTH DAILY  ?  Dispense:  90 tablet  ?  Refill:  1  ? ?1. GAD (generalized anxiety disorder) ?2. Moderate episode of recurrent major depressive disorder (HCC) ?Current PHQ-9 and GAD-7 score are both 6 today, which is greatly improved from where he was previously.  He is doing well  with bupropion 150 mg twice daily.  He is also doing well with Lexapro 10 mg daily.  Continue both of these medications and sent refills as requested.  Recheck this summer and consider starting to wean off the Lexapro. ? ?3. Essential hypertension ?Blood pressure is to goal.  As he continues to use his CPAP hopefully we can start to wean off the losartan.  For now continue losartan 25 mg.  Sent refill as requested. ? ?4. Mild obstructive sleep apnea-hypopnea syndrome ?I personally reviewed his sleep study report.  He has started using his CPAP in the last week and he is hoping to get adjusted to it.  No other concerns. ? ? ?This note was prepared with assistance of Conservation officer, historic buildings. Occasional wrong-word or sound-a-like substitutions may have occurred due to the inherent limitations of voice recognition software. ? ? ?Etsuko Dierolf M Devaris Quirk, PA-C ?

## 2021-09-23 ENCOUNTER — Ambulatory Visit: Payer: 59 | Admitting: Psychologist

## 2021-09-27 ENCOUNTER — Ambulatory Visit (INDEPENDENT_AMBULATORY_CARE_PROVIDER_SITE_OTHER): Payer: 59 | Admitting: Psychologist

## 2021-09-27 DIAGNOSIS — F411 Generalized anxiety disorder: Secondary | ICD-10-CM | POA: Diagnosis not present

## 2021-09-27 NOTE — Progress Notes (Signed)
[facility] Counselor/Therapist Progress Note ? ?Patient ID: Alejandro Weber, MRN: [id number],   ? ?Date: 09/27/2021 ? ?Time Spent: 3:05 pm to 3:50 pm; total time: 45 minutes ? ? This session was held via video webex teletherapy due to the coronavirus risk at this time. The patient consented to video teletherapy and was located at his home during this session. He is aware it is the responsibility of the patient to secure confidentiality on his end of the session. The provider was in a private home office for the duration of this session. Limits of confidentiality were discussed with the patient.  ?  ? ?Treatment Type: Individual Therapy ? ?Reported Symptoms: Anxiety ? ?Mental Status Exam: ?Appearance:  Well Groomed     ?Behavior: Appropriate  ?Motor: Normal  ?Speech/Language:  Normal Rate  ?Affect: Appropriate  ?Mood: normal  ?Thought process: normal  ?Thought content:   WNL  ?Sensory/Perceptual disturbances:   WNL  ?Orientation: oriented to person, place, and time/date  ?Attention: Good  ?Concentration: Good  ?Memory: WNL  ?Fund of knowledge:  Good  ?Insight:   Fair  ?Judgment:  Good  ?Impulse Control: Good  ? ?Risk Assessment: ?Danger to Self:  No ?Self-injurious Behavior: No ?Danger to Others: No ?Duty to Warn:no ?Physical Aggression / Violence:No  ?Access to Firearms a concern: No  ?Gang Involvement:No  ? ?Subjective: Beginning the session patient described himself as not doing well and described the last two weeks as eventful. Elaborating, he stated that his wife has approached him about separating and did this two weeks ago. Patient spent the session processing thoughts and emotions surrounding this theme. He voiced that he is now beginning to live his values out and that he wants to take steps to try to prepare the relationship.  He denied suicidal and homicidal ideation.   ? ?Interventions:  Worked on developing a therapeutic relationship with the patient using active listening and reflective  statements. Provided emotional support using empathy and validation. Reviewed events since the last session. Normalized and validated expressed thoughts and emotions. Processed the different emotions that patient was experiencing. Used socratic questions to assist the patient gain insight into self. Provided psychoeducation about dual relationships, and what the research says about staying together versus separating for children. Discussed next steps for counseling. Assessed for suicidal and homicidal ideation.  ? ?Homework: NA ? ?Next Session: Emotional support ? ?Diagnosis: F41.1 generalized anxiety disorder ? ?Plan:  ? ?Client Abilities:  Friendly and easy to develop rapport ? ?Client Preferences: ACT and CBT ? ?Client statement of Needs: Coping skills ? ?Treatment Level: Outpatient  ? ?Goals ?Reduce overall frequency, intensity, and duration of anxiety ?Stabilize anxiety level wile increasing ability to function ?Enhance ability to effectively cope with full variety of stressors ?Learn and implement coping skills that result in a reduction of anxiety  ? ?Objectives target date for all objectives is 06/17/2022 ?Verbalize an understanding of the cognitive, physiological, and behavioral components of anxiety ?Learning and implement calming skills to reduce overall anxiety ?Verbalize an understanding of the role that cognitive biases play in excessive irrational worry and persistent anxiety symptoms ?Identify, challenge, and replace based fearful talk ?Learn and implement problem solving strategies ?Identify and engage in pleasant activities ?Learning and implement personal and interpersonal skills to reduce anxiety and improve interpersonal relationships ?Learn to accept limitations in life and commit to tolerating, rather than avoiding, unpleasant emotions while accomplishing meaningful goals ?Identify major life conflicts from the past and present that form the basis for present  anxiety ?Maintain involvement in  work, family, and social activities ?Reestablish a consistent sleep-wake cycle ?Cooperate with a medical evaluation  ?Interventions ?Engage the patient in behavioral activation ?Use instruction, modeling, and role-playing to build the client's general social, communication, and/or conflict resolution skills ?Use Acceptance and Commitment Therapy to help client accept uncomfortable realities in order to accomplish value-consistent goals ?Reinforce the client's insight into the role of his/her past emotional pain and present anxiety  ?Support the client in following through with work, family, and social activities ?Teach and implement sleep hygiene practices  ?Refer the patient to a physician for a psychotropic medication consultation ?Monito the clint's psychotropic medication compliance ?Discuss how anxiety typically involves excessive worry, various bodily expressions of tension, and avoidance of what is threatening that interact to maintain the problem  ?Teach the patient relaxation skills ?Assign the patient homework ?Discuss examples demonstrating that unrealistic worry overestimates the probability of threats and underestimates patient's ability  ?Assist the patient in analyzing his or her worries ?Help patient understand that avoidance is reinforcing  ? ?The patient and clinician reviewed the treatment plan on 07/22/2021 ? ?Conception Chancy, PsyD ? ? ?

## 2021-10-28 ENCOUNTER — Ambulatory Visit (INDEPENDENT_AMBULATORY_CARE_PROVIDER_SITE_OTHER): Payer: 59 | Admitting: Psychologist

## 2021-10-28 DIAGNOSIS — F411 Generalized anxiety disorder: Secondary | ICD-10-CM

## 2021-10-28 NOTE — Progress Notes (Signed)
Behavioral Health Counselor/Therapist Progress Note ? ?Patient ID: Alejandro Weber, MRN: [id number],   ? ?Date: 10/28/2021 ? ?Time Spent: 8:06 am to 8:46 am; total time: 40 minutes ? ?This session was held via in person. The patient consented to in-person therapy and was in the clinician's office. Limits of confidentiality were discussed with the patient.  ? ?Treatment Type: Individual Therapy ? ?Reported Symptoms: Less anxiety and improved relationships at home ? ?Mental Status Exam: ?Appearance:  Well Groomed     ?Behavior: Appropriate  ?Motor: Normal  ?Speech/Language:  Normal Rate  ?Affect: Appropriate  ?Mood: normal  ?Thought process: normal  ?Thought content:   WNL  ?Sensory/Perceptual disturbances:   WNL  ?Orientation: oriented to person, place, and time/date  ?Attention: Good  ?Concentration: Good  ?Memory: WNL  ?Fund of knowledge:  Good  ?Insight:   Fair  ?Judgment:  Good  ?Impulse Control: Good  ? ?Risk Assessment: ?Danger to Self:  No ?Self-injurious Behavior: No ?Danger to Others: No ?Duty to Warn:no ?Physical Aggression / Violence:No  ?Access to Firearms a concern: No  ?Gang Involvement:No  ? ?Subjective: Beginning the session patient described himself as doing well. Elaborating, he voiced that he and his wife are working on addressing the issues in their marriage and both of them are taking steps. Continuing to talk, he voiced that he is truly beginning to live out his his values at home. He processed thoughts and emotions about how he is doing through. Patient reflected on ways to practically  live out his values. He described himself as doing better. He was agreeable to following up.  He denied suicidal and homicidal ideation.   ? ?Interventions:  Worked on developing a therapeutic relationship with the patient using active listening and reflective statements. Provided emotional support using empathy and validation. Praised the patient for doing better. Explored what has assisted the patient.  Used socratic questions to assist the patient gain insight into self. Challenged some of the thoughts express. Provided psychoeducation about the book Work, Parent, Thrive by Dr. Aviva Signs. Assisted in problem solving. Explored ways that patient could proactively live out his values at home. Processed next steps for counseling. Assessed for suicidal and homicidal ideation.  ? ?Homework: NA ? ?Next Session: Emotional support and possibly terminate ? ?Diagnosis: F41.1 generalized anxiety disorder ? ?Plan:  ? ?Client Abilities:  Friendly and easy to develop rapport ? ?Client Preferences: ACT and CBT ? ?Client statement of Needs: Coping skills ? ?Treatment Level: Outpatient  ? ?Goals ?Reduce overall frequency, intensity, and duration of anxiety ?Stabilize anxiety level wile increasing ability to function ?Enhance ability to effectively cope with full variety of stressors ?Learn and implement coping skills that result in a reduction of anxiety  ? ?Objectives target date for all objectives is 06/17/2022 ?Verbalize an understanding of the cognitive, physiological, and behavioral components of anxiety ?Learning and implement calming skills to reduce overall anxiety ?Verbalize an understanding of the role that cognitive biases play in excessive irrational worry and persistent anxiety symptoms ?Identify, challenge, and replace based fearful talk ?Learn and implement problem solving strategies ?Identify and engage in pleasant activities ?Learning and implement personal and interpersonal skills to reduce anxiety and improve interpersonal relationships ?Learn to accept limitations in life and commit to tolerating, rather than avoiding, unpleasant emotions while accomplishing meaningful goals ?Identify major life conflicts from the past and present that form the basis for present anxiety ?Maintain involvement in work, family, and social activities ?Reestablish a consistent sleep-wake cycle ?Cooperate with a medical  evaluation   ?Interventions ?Engage the patient in behavioral activation ?Use instruction, modeling, and role-playing to build the client's general social, communication, and/or conflict resolution skills ?Use Acceptance and Commitment Therapy to help client accept uncomfortable realities in order to accomplish value-consistent goals ?Reinforce the client's insight into the role of his/her past emotional pain and present anxiety  ?Support the client in following through with work, family, and social activities ?Teach and implement sleep hygiene practices  ?Refer the patient to a physician for a psychotropic medication consultation ?Monito the clint's psychotropic medication compliance ?Discuss how anxiety typically involves excessive worry, various bodily expressions of tension, and avoidance of what is threatening that interact to maintain the problem  ?Teach the patient relaxation skills ?Assign the patient homework ?Discuss examples demonstrating that unrealistic worry overestimates the probability of threats and underestimates patient's ability  ?Assist the patient in analyzing his or her worries ?Help patient understand that avoidance is reinforcing  ? ?The patient and clinician reviewed the treatment plan on 07/22/2021 ? ?Hilbert Corrigan, PsyD ? ?

## 2021-11-14 NOTE — Progress Notes (Signed)
? ? ?PATIENT: Alejandro Weber ?DOB: Sep 20, 1985 ? ?REASON FOR VISIT: follow up ?HISTORY FROM: patient ? ?Chief Complaint  ?Patient presents with  ? Obstructive Sleep Apnea  ?  Rm 1, alone. Here for initial CPAP f/u. Pt reports doing well on CPAP. Has felt more well rested.   ?  ? ?HISTORY OF PRESENT ILLNESS: ? ?11/18/21 ALL:  ?Alejandro Weber is a 36 y.o. male here today for follow up for OSA on CPAP.  He was seen in consult with Dr Vickey Huger 07/2021 for snoring and concerns of witnessed apneic events. HST 08/12/2021 showed mild OSA with AHI 15.9/hr worsening in REM sleep with REM AHI 24.3/hr. No hypoxia. AutoPAP advised. Since, her is doing well. He is using CPAP most every night. He admits that he has missed a couple of days and others may not get the full four hours due to having small kids at home. His youngest daughter is and not sleeping through the night at times. He does report feeling better rested. He is not snoring. He wakes feeling refreshed. He denies concerns with machine or supplies.  ? ? ? ?HISTORY: (copied from Dr Dohmeier's previous note) ? ?Alejandro Weber is a 36 y.o. Caucasian male patient seen here  first time on 07-29-2021 upon PCP referral on 07/29/2021  for a Sleep Medicine consultation.  ?Chief concern according to patient :  "My wife is a Engineer, civil (consulting) and told me I have apnea, I know I snore. " ?  ?I have the pleasure of seeing Alejandro Weber today, a right-handed White or Caucasian male with witnessed apneas and he has a past medical history of Anxiety, History of chicken pox, and History of kidney stones (2006). ?   ?Sleep relevant medical history: Nocturia, uncorrected deviated septum, rhinitis. ?  ? Family medical /sleep history: father was diagnosed with REM BD, PD, OSA but refused CPAP. He died in his 61 th year.  ?  ?Social history: patient has paternal half siblings- much older,  Patient is working as IT trainer and lives in a household with RN spouse,  3 children , 5, 3 and 1 year -old. No Pets,  Tobacco use: none.  ? ETOH use 1-2 drinks, 3 nights a week- Caffeine intake in form of Coffee( 3 cups in AM ) Soda( /) Tea ( /) or energy drinks. ?Regular exercise:  none .   ?  ?Sleep habits are as follows: The patient's dinner time is between 6-7 PM. The patient goes to bed at 9-10 PM and continues to sleep for 4 hours, wakes for one bathroom break. ?He takes antacids, has GERD.  ?The preferred sleep position is variable - supine and prone, with the support of 1 pillow, on a flat bed. Dreams are reportedly moderately frequent/vivid.  ?6.30  AM is the usual rise time. The patient wakes up at 5.30 with an alarm. Snooze button.  ?He reports not feeling refreshed or restored in AM, with symptoms such as dry mouth. Naps are taken on weekends frequently, lasting from 30 to 120 minutes and are more refreshing than nocturnal sleep.  ? ? ?REVIEW OF SYSTEMS: Out of a complete 14 system review of symptoms, the patient complains only of the following symptoms, none and all other reviewed systems are negative. ? ?ESS: 4/24, previously 7/24.  ? ?ALLERGIES: ?Allergies  ?Allergen Reactions  ? Amoxicillin Hives  ? ? ?HOME MEDICATIONS: ?Outpatient Medications Prior to Visit  ?Medication Sig Dispense Refill  ? buPROPion (ZYBAN) 150 MG 12 hr tablet TAKE  1 TABLET(150 MG) BY MOUTH TWICE DAILY 180 tablet 1  ? escitalopram (LEXAPRO) 10 MG tablet Take 1 tablet (10 mg total) by mouth daily. 90 tablet 3  ? fluticasone (FLONASE) 50 MCG/ACT nasal spray Place 2 sprays into both nostrils daily. 16 g 1  ? levocetirizine (XYZAL) 5 MG tablet Take 1 tablet (5 mg total) by mouth every evening. 30 tablet 1  ? losartan (COZAAR) 25 MG tablet TAKE 1 TABLET(25 MG) BY MOUTH DAILY 90 tablet 1  ? Multiple Vitamins-Minerals (MENS MULTIVITAMIN PLUS PO) Take by mouth daily.    ? ?No facility-administered medications prior to visit.  ? ? ?PAST MEDICAL HISTORY: ?Past Medical History:  ?Diagnosis Date  ? Anxiety   ? History of chicken pox   ? childhood  ? History  of kidney stones 2006  ? ? ?PAST SURGICAL HISTORY: ?Past Surgical History:  ?Procedure Laterality Date  ? NO PAST SURGERIES  12/22/2011  ? VASECTOMY Bilateral 11/2020  ? ? ?FAMILY HISTORY: ?Family History  ?Problem Relation Age of Onset  ? Hyperlipidemia Mother   ?     Father,  MGM  ? Heart disease Father 61  ?     cad-angioplasty  ? Hypertension Father   ?     MGM  ? Esophageal cancer Father   ? Colon cancer Paternal Grandfather 33  ? Prostate cancer Maternal Grandfather   ? Coronary artery disease Maternal Grandfather   ? Diabetes Paternal Grandmother   ? ? ?SOCIAL HISTORY: ?Social History  ? ?Socioeconomic History  ? Marital status: Married  ?  Spouse name: Not on file  ? Number of children: 3  ? Years of education: Not on file  ? Highest education level: Not on file  ?Occupational History  ? Occupation: IT trainer  ?Tobacco Use  ? Smoking status: Never  ? Smokeless tobacco: Never  ?Vaping Use  ? Vaping Use: Never used  ?Substance and Sexual Activity  ? Alcohol use: No  ?  Alcohol/week: 0.0 standard drinks  ? Drug use: No  ? Sexual activity: Yes  ?  Partners: Female  ?Other Topics Concern  ? Not on file  ?Social History Narrative  ? Not on file  ? ?Social Determinants of Health  ? ?Financial Resource Strain: Not on file  ?Food Insecurity: Not on file  ?Transportation Needs: Not on file  ?Physical Activity: Not on file  ?Stress: Not on file  ?Social Connections: Not on file  ?Intimate Partner Violence: Not on file  ? ? ? ?PHYSICAL EXAM ? ?Vitals:  ? 11/18/21 0842  ?BP: 130/80  ?Pulse: 68  ?Weight: 171 lb 8 oz (77.8 kg)  ?Height: 5\' 8"  (1.727 m)  ? ?Body mass index is 26.08 kg/m?. ? ?Generalized: Well developed, in no acute distress  ?Cardiology: normal rate and rhythm, no murmur noted ?Respiratory: clear to auscultation bilaterally  ?Neurological examination  ?Mentation: Alert oriented to time, place, history taking. Follows all commands speech and language fluent ?Cranial nerve II-XII: Pupils were equal round reactive  to light. Extraocular movements were full, visual field were full  ?Motor: The motor testing reveals 5 over 5 strength of all 4 extremities. Good symmetric motor tone is noted throughout.  ?Gait and station: Gait is normal.  ? ? ?DIAGNOSTIC DATA (LABS, IMAGING, TESTING) ?- I reviewed patient records, labs, notes, testing and imaging myself where available. ? ?   ? View : No data to display.  ?  ?  ?  ?  ? ?Lab Results  ?Component  Value Date  ? WBC 4.9 02/15/2021  ? HGB 14.5 02/15/2021  ? HCT 42.1 02/15/2021  ? MCV 89.0 02/15/2021  ? PLT 317.0 02/15/2021  ? ?   ?Component Value Date/Time  ? NA 137 02/15/2021 0841  ? K 4.3 02/15/2021 0841  ? CL 104 02/15/2021 0841  ? CO2 26 02/15/2021 0841  ? GLUCOSE 99 02/15/2021 0841  ? BUN 14 02/15/2021 0841  ? CREATININE 1.17 02/15/2021 0841  ? CREATININE 0.95 12/22/2011 1408  ? CALCIUM 9.3 02/15/2021 0841  ? PROT 7.5 02/15/2021 0841  ? ALBUMIN 4.5 02/15/2021 0841  ? AST 21 02/15/2021 0841  ? ALT 23 02/15/2021 0841  ? ALKPHOS 59 02/15/2021 0841  ? BILITOT 0.6 02/15/2021 0841  ? ?Lab Results  ?Component Value Date  ? CHOL 193 02/15/2021  ? HDL 39.70 02/15/2021  ? LDLCALC 132 (H) 02/15/2021  ? TRIG 106.0 02/15/2021  ? CHOLHDL 5 02/15/2021  ? ?Lab Results  ?Component Value Date  ? HGBA1C 5.2 05/02/2019  ? ?No results found for: VITAMINB12 ?Lab Results  ?Component Value Date  ? TSH 0.87 06/20/2016  ? ? ? ?ASSESSMENT AND PLAN ?36 y.o. year old male  has a past medical history of Anxiety, History of chicken pox, and History of kidney stones (2006). here with  ? ?  ICD-10-CM   ?1. OSA on CPAP  G47.33   ? Z99.89   ?  ?  ? ? ?Alejandro Weber is doing well on CPAP therapy. Compliance report reveals excellent daily and acceptable four hour compliance. He was encouraged to continue using CPAP nightly and for greater than 4 hours each night. Risks of untreated sleep apnea review and education materials provided. Healthy lifestyle habits encouraged. He will follow up in 1 year, sooner if needed.  He verbalizes understanding and agreement with this plan.  ? ? ?No orders of the defined types were placed in this encounter. ?  ? ?No orders of the defined types were placed in this encounter. ?  ?

## 2021-11-14 NOTE — Patient Instructions (Addendum)
Please continue using your CPAP regularly. While your insurance requires that you use CPAP at least 4 hours each night on 70% of the nights, I recommend, that you not skip any nights and use it throughout the night if you can. Getting used to CPAP and staying with the treatment long term does take time and patience and discipline. Untreated obstructive sleep apnea when it is moderate to severe can have an adverse impact on cardiovascular health and raise her risk for heart disease, arrhythmias, hypertension, congestive heart failure, stroke and diabetes. Untreated obstructive sleep apnea causes sleep disruption, nonrestorative sleep, and sleep deprivation. This can have an impact on your day to day functioning and cause daytime sleepiness and impairment of cognitive function, memory loss, mood disturbance, and problems focussing. Using CPAP regularly can improve these symptoms.   Follow up in 1 year, sooner if needed  

## 2021-11-18 ENCOUNTER — Encounter: Payer: Self-pay | Admitting: Family Medicine

## 2021-11-18 ENCOUNTER — Ambulatory Visit (INDEPENDENT_AMBULATORY_CARE_PROVIDER_SITE_OTHER): Payer: 59 | Admitting: Family Medicine

## 2021-11-18 VITALS — BP 130/80 | HR 68 | Ht 68.0 in | Wt 171.5 lb

## 2021-11-18 DIAGNOSIS — G4733 Obstructive sleep apnea (adult) (pediatric): Secondary | ICD-10-CM

## 2021-11-18 DIAGNOSIS — Z9989 Dependence on other enabling machines and devices: Secondary | ICD-10-CM | POA: Diagnosis not present

## 2021-12-22 ENCOUNTER — Other Ambulatory Visit: Payer: Self-pay | Admitting: Physician Assistant

## 2021-12-22 DIAGNOSIS — F411 Generalized anxiety disorder: Secondary | ICD-10-CM

## 2021-12-30 ENCOUNTER — Ambulatory Visit (INDEPENDENT_AMBULATORY_CARE_PROVIDER_SITE_OTHER): Payer: 59 | Admitting: Psychologist

## 2021-12-30 DIAGNOSIS — F411 Generalized anxiety disorder: Secondary | ICD-10-CM | POA: Diagnosis not present

## 2021-12-31 ENCOUNTER — Telehealth: Payer: Self-pay | Admitting: *Deleted

## 2022-01-03 NOTE — Telephone Encounter (Signed)
Deniedon June 27 Request Reference Number: [id number]. BUPROPION TAB 150MG  SR is denied for not meeting the prior authorization requirement(s). Details of this decision are in the notice attached below or have been faxed to yo

## 2022-01-09 NOTE — Telephone Encounter (Signed)
Contacted [facility] and unable to understand customer service rep; requested denial letter be sent via fax so this issue can be resolved for patient. 

## 2022-01-24 ENCOUNTER — Encounter: Payer: Self-pay | Admitting: Physician Assistant

## 2022-01-24 ENCOUNTER — Ambulatory Visit (INDEPENDENT_AMBULATORY_CARE_PROVIDER_SITE_OTHER): Payer: 59 | Admitting: Physician Assistant

## 2022-01-24 VITALS — BP 135/93 | HR 86 | Temp 98.1°F | Ht 68.0 in | Wt 155.4 lb

## 2022-01-24 DIAGNOSIS — F331 Major depressive disorder, recurrent, moderate: Secondary | ICD-10-CM

## 2022-01-24 DIAGNOSIS — I10 Essential (primary) hypertension: Secondary | ICD-10-CM

## 2022-01-24 DIAGNOSIS — G4733 Obstructive sleep apnea (adult) (pediatric): Secondary | ICD-10-CM

## 2022-01-24 DIAGNOSIS — F411 Generalized anxiety disorder: Secondary | ICD-10-CM | POA: Diagnosis not present

## 2022-01-24 MED ORDER — BUPROPION HCL ER (XL) 300 MG PO TB24: 300.0000 mg | ORAL_TABLET | Freq: Every day | ORAL | 1 refills | Status: DC

## 2022-01-24 NOTE — Progress Notes (Signed)
Subjective:    Patient ID: Alejandro Weber, male    DOB: Dec 11, 1985, 36 y.o.   MRN: [id number]  Chief Complaint  Patient presents with   Follow-up    Pt here for a f/u; pt has no concerns to discuss    HPI Patient is in today for f/up on GAD, Depression, OSA, BP.   States that his family was hit with norovirus in May. He was really sick for a few days and stopped taking his medications. Tried to do without for awhile, but started back on the Wellbutrin. Not taking Lexapro or Losartan anymore. Moods are overall good and stable. He has been working on weight loss by cutting out sodas, moving more, and being intentional on lifestyle choices. 125/85 average BP at home. Still wearing CPAP. Sleep is good. Family relationships are good.  No other concerns to address.    Past Medical History:  Diagnosis Date   Anxiety    History of chicken pox    childhood   History of kidney stones 2006    Past Surgical History:  Procedure Laterality Date   NO PAST SURGERIES  12/22/2011   VASECTOMY Bilateral 11/2020    Family History  Problem Relation Age of Onset   Hyperlipidemia Mother        Father,  MGM   Heart disease Father 76       cad-angioplasty   Hypertension Father        MGM   Esophageal cancer Father    Colon cancer Paternal Grandfather 37   Prostate cancer Maternal Grandfather    Coronary artery disease Maternal Grandfather    Diabetes Paternal Grandmother     Social History   Tobacco Use   Smoking status: Never   Smokeless tobacco: Never  Vaping Use   Vaping Use: Never used  Substance Use Topics   Alcohol use: No    Alcohol/week: 0.0 standard drinks of alcohol   Drug use: No     Allergies  Allergen Reactions   Amoxicillin Hives    Review of Systems NEGATIVE UNLESS OTHERWISE INDICATED IN HPI      Objective:     BP (!) 135/93 (BP Location: Left Arm)   Pulse 86   Temp 98.1 F (36.7 C) (Temporal)   Ht 5\' 8"  (1.727 m)   Wt 155 lb 6.4 oz (70.5 kg)    SpO2 98%   BMI 23.63 kg/m   Wt Readings from Last 3 Encounters:  01/24/22 155 lb 6.4 oz (70.5 kg)  11/18/21 171 lb 8 oz (77.8 kg)  07/29/21 178 lb (80.7 kg)    BP Readings from Last 3 Encounters:  01/24/22 (!) 135/93  11/18/21 130/80  09/20/21 130/84     Physical Exam Constitutional:      Appearance: Normal appearance. He is normal weight.  Cardiovascular:     Rate and Rhythm: Normal rate and regular rhythm.     Pulses: Normal pulses.     Heart sounds: No murmur heard. Pulmonary:     Effort: Pulmonary effort is normal.     Breath sounds: Normal breath sounds.  Skin:    General: Skin is warm.  Neurological:     General: No focal deficit present.     Mental Status: He is alert and oriented to person, place, and time.  Psychiatric:        Mood and Affect: Mood normal.        Behavior: Behavior normal.        Thought  Content: Thought content normal.        Judgment: Judgment normal.        Assessment & Plan:   Problem List Items Addressed This Visit       Cardiovascular and Mediastinum   Essential hypertension     Other   GAD (generalized anxiety disorder) - Primary   Relevant Medications   buPROPion (WELLBUTRIN XL) 300 MG 24 hr tablet   Moderate episode of recurrent major depressive disorder (HCC)   Relevant Medications   buPROPion (WELLBUTRIN XL) 300 MG 24 hr tablet     Meds ordered this encounter  Medications   buPROPion (WELLBUTRIN XL) 300 MG 24 hr tablet    Sig: Take 1 tablet (300 mg total) by mouth daily.    Dispense:  90 tablet    Refill:  1    Order Specific Question:   Supervising Provider    Answer:   Durene Cal, STEPHEN O [[id number]]    1. GAD (generalized anxiety disorder) 2. Moderate episode of recurrent major depressive disorder [facility]) Flowsheet Row Office Visit from 01/24/2022 in [facility]  PHQ-9 Total Score 4      Doing well, stable, off Lexapro now Continue on Wellbutrin Sent Wellbutrin XL 300 mg daily  3.  Essential hypertension 4. OSA Congratulated him on weight loss BP is elevated today, but he hasn't taken his medication yet and he was nervous about his reading being elevated Numbers are good at home Continue to track Keep on the CPAP  Recheck in about 5 months for CPE, fasting labs, med check   Starlina Lapre M Johntavius Shepard, PA-C

## 2022-03-05 ENCOUNTER — Other Ambulatory Visit: Payer: Self-pay | Admitting: Physician Assistant

## 2022-03-31 ENCOUNTER — Encounter: Payer: Self-pay | Admitting: *Deleted

## 2022-05-27 ENCOUNTER — Emergency Department (HOSPITAL_BASED_OUTPATIENT_CLINIC_OR_DEPARTMENT_OTHER)
Admission: EM | Admit: 2022-05-27 | Discharge: 2022-05-27 | Disposition: A | Discharge status: Home / Self Care | Payer: 59 | Attending: Emergency Medicine | Admitting: Emergency Medicine

## 2022-05-27 ENCOUNTER — Other Ambulatory Visit: Payer: Self-pay

## 2022-05-27 ENCOUNTER — Encounter (HOSPITAL_BASED_OUTPATIENT_CLINIC_OR_DEPARTMENT_OTHER): Payer: Self-pay

## 2022-05-27 ENCOUNTER — Emergency Department (HOSPITAL_BASED_OUTPATIENT_CLINIC_OR_DEPARTMENT_OTHER): Payer: 59

## 2022-05-27 DIAGNOSIS — I10 Essential (primary) hypertension: Secondary | ICD-10-CM | POA: Diagnosis not present

## 2022-05-27 DIAGNOSIS — R079 Chest pain, unspecified: Secondary | ICD-10-CM | POA: Diagnosis present

## 2022-05-27 DIAGNOSIS — Z79899 Other long term (current) drug therapy: Secondary | ICD-10-CM | POA: Insufficient documentation

## 2022-05-27 DIAGNOSIS — F419 Anxiety disorder, unspecified: Secondary | ICD-10-CM | POA: Diagnosis not present

## 2022-05-27 HISTORY — DX: Essential (primary) hypertension: I10

## 2022-05-27 LAB — BASIC METABOLIC PANEL
Anion gap: 7 (ref 5–15)
BUN: 18 mg/dL (ref 6–20)
CO2: 23 mmol/L (ref 22–32)
Calcium: 9.6 mg/dL (ref 8.9–10.3)
Chloride: 107 mmol/L (ref 98–111)
Creatinine, Ser: 1.03 mg/dL (ref 0.61–1.24)
GFR, Estimated: >60 mL/min (ref >60)
Glucose, Bld: 99 mg/dL (ref 70–99)
Potassium: 4.1 mmol/L (ref 3.5–5.1)
Sodium: 137 mmol/L (ref 135–145)

## 2022-05-27 LAB — CBC
HCT: 44.7 % (ref 39.0–52.0)
Hemoglobin: 15.4 g/dL (ref 13.0–17.0)
MCH: 30.2 pg (ref 26.0–34.0)
MCHC: 34.5 g/dL (ref 30.0–36.0)
MCV: 87.6 fL (ref 80.0–100.0)
Platelets: 289 10*3/uL (ref 150–400)
RBC: 5.1 MIL/uL (ref 4.22–5.81)
RDW: 12.7 % (ref 11.5–15.5)
WBC: 6.6 10*3/uL (ref 4.0–10.5)
nRBC: 0 % (ref 0.0–0.2)

## 2022-05-27 LAB — TROPONIN I (HIGH SENSITIVITY): Troponin I (High Sensitivity): 3 ng/L (ref <18)

## 2022-05-27 MED ORDER — ACETAMINOPHEN 325 MG PO TABS
650.0000 mg | ORAL_TABLET | Freq: Once | ORAL | Status: AC
  Administered 2022-05-27: 650 mg via ORAL
  Filled 2022-05-27: qty 2

## 2022-05-27 NOTE — ED Triage Notes (Signed)
Pt arrived POV. Pt reports chest pain for 3 days. Pain located central chest without radiation. Dull tightness

## 2022-05-27 NOTE — Discharge Instructions (Signed)
You were seen in the emergency department for your chest pain.  You had no signs of heart attack or stress on your heart, your heart appeared to normal size with no signs of any fluid on your lungs.  It is unclear what is causing your chest pain at this time, but you can follow-up with your primary doctor to have your symptoms rechecked and to determine if you need further work-up done as an outpatient.  You should return to the emergency department if your pain gets significantly worse, you have severe shortness of breath, you pass out or if you have any other new or concerning symptoms.

## 2022-05-27 NOTE — ED Notes (Signed)
Signature not working for waiver. Secretary notified and ticket placed to IT

## 2022-05-27 NOTE — ED Provider Notes (Signed)
[facility] EMERGENCY DEPARTMENT Provider Note   CSN: [id number] Arrival date & time: 05/27/22  0753     History  Chief Complaint  Patient presents with   Chest Pain    Alejandro Weber is a 36 y.o. male.  Patient is a 36 year old male with a past medical history of anxiety and hypertension not currently on medication presenting to the emergency department with chest pain.  Patient states that for the last 3 days he has had on and off chest pain.  He states that it mostly occurs in the morning after waking up.  He states that it feels like a gassy type of discomfort in his mid chest.  He states it is nonradiating.  He states that yesterday morning he felt lightheaded with the chest pain like he might pass out.  He states that he took Motrin for the pain which improved it but when he woke up this morning the pain recurred.  He denies any shortness of breath, fevers or cough.  He denies any lower extremity swelling.  He denies any history of blood clots, recent hospitalizations or surgeries, recent long travels in the car plane, cancer history or hormone use.  Patient does report a family history of CAD in his father in his 43s and grandfather in his 36s.  The history is provided by the patient.  Chest Pain      Home Medications Prior to Admission medications   Medication Sig Start Date End Date Taking? Authorizing Provider  buPROPion (WELLBUTRIN XL) 300 MG 24 hr tablet Take 1 tablet (300 mg total) by mouth daily. 01/24/22  Yes Allwardt, Alyssa M, PA-C  buPROPion (ZYBAN) 150 MG 12 hr tablet TAKE 1 TABLET(150 MG) BY MOUTH TWICE DAILY 09/20/21   Allwardt, Alyssa M, PA-C  fluticasone (FLONASE) 50 MCG/ACT nasal spray Place 2 sprays into both nostrils daily. 02/09/20   Waldon Merl, PA-C  levocetirizine (XYZAL) 5 MG tablet Take 1 tablet (5 mg total) by mouth every evening. 02/09/20   Waldon Merl, PA-C  losartan (COZAAR) 25 MG tablet TAKE 1 TABLET BY MOUTH DAILY 03/05/22    Allwardt, Alyssa M, PA-C  Multiple Vitamins-Minerals (MENS MULTIVITAMIN PLUS PO) Take by mouth daily.    [provider]      Allergies    Amoxicillin    Review of Systems   Review of Systems  Cardiovascular:  Positive for chest pain.    Physical Exam Updated Vital Signs BP (!) 174/111 (BP Location: Right Arm)   Pulse 76   Temp 98.4 F (36.9 C)   Resp 20   Ht 5\' 8"  (1.727 m)   Wt 74.3 kg   SpO2 100%   BMI 24.89 kg/m  Physical Exam Vitals and nursing note reviewed.  Constitutional:      General: He is not in acute distress.    Appearance: He is well-developed.  HENT:     Head: Normocephalic and atraumatic.  Eyes:     Extraocular Movements: Extraocular movements intact.  Cardiovascular:     Rate and Rhythm: Normal rate and regular rhythm.     Pulses:          Radial pulses are 2+ on the right side and 2+ on the left side.     Heart sounds: Normal heart sounds.  Pulmonary:     Effort: Pulmonary effort is normal.     Breath sounds: Normal breath sounds.  Abdominal:     Palpations: Abdomen is soft.  Tenderness: There is no abdominal tenderness.  Musculoskeletal:        General: Normal range of motion.     Cervical back: Normal range of motion and neck supple.     Right lower leg: No edema.     Left lower leg: No edema.  Skin:    General: Skin is warm and dry.  Neurological:     General: No focal deficit present.     Mental Status: He is alert and oriented to person, place, and time.  Psychiatric:        Mood and Affect: Mood is anxious (Mildly).        Behavior: Behavior normal.     ED Results / Procedures / Treatments   Labs (all labs ordered are listed, but only abnormal results are displayed) Labs Reviewed  BASIC METABOLIC PANEL  CBC  TROPONIN I (HIGH SENSITIVITY)    EKG EKG Interpretation  Date/Time:  Tuesday May 27 2022 08:01:32 EST Ventricular Rate:  80 PR Interval:  126 QRS Duration: 100 QT Interval:  348 QTC  Calculation: 402 R Axis:   85 Text Interpretation: Sinus rhythm Consider left atrial enlargement Minimal ST depression, inferior leads No previous ECGs available Confirmed by Elayne Snare ([id number]) on 05/27/2022 9:04:58 AM  Radiology DG Chest 2 View  Result Date: 05/27/2022 CLINICAL DATA:  Chest pain EXAM: CHEST - 2 VIEW COMPARISON:  None Available. FINDINGS: The cardiomediastinal silhouette is within normal limits. There is no focal airspace consolidation. There is no pleural effusion or pneumothorax. No acute osseous abnormality. IMPRESSION: No evidence of acute cardiopulmonary disease. Electronically Signed   By: Caprice Renshaw M.D.   On: 05/27/2022 08:29    Procedures Procedures    Medications Ordered in ED Medications  acetaminophen (TYLENOL) tablet 650 mg (650 mg Oral Given 05/27/22 0908)    ED Course/ Medical Decision Making/ A&P Clinical Course as of 05/27/22 0945  Tue May 27, 2022  0940 In normal range including negative troponin.  Single troponin is sufficient as the patient's pain has been ongoing for several days.  Patient is stable for discharge home as he is low risk and recommended primary care follow-up. [VK]    Clinical Course User Index [VK] Rexford Maus, DO           HEART Score: 1                Medical Decision Making This patient presents to the ED with chief complaint(s) of chest pain with pertinent past medical history of anxiety, hypertension which further complicates the presenting complaint. The complaint involves an extensive differential diagnosis and also carries with it a high risk of complications and morbidity.    The differential diagnosis includes ACS, arrhythmia, electrolyte abnormality, pneumonia, pneumothorax, pulmonary edema, pleural effusion, PE unlikely as patient is PERC negative, pain is nonradiating without neurologic deficits making dissection unlikely, considering gastritis or GERD  Additional history obtained: Additional history  obtained from N/A Records reviewed Primary Care Documents  ED Course and Reassessment: Upon patient's arrival to the emergency department, he is awake and alert, mildly anxious appearing though in no acute distress.  EKG was performed on arrival showed normal sinus rhythm without any ischemic changes.  Patient will have labs including troponin performed to evaluate for possible causes of his chest pain.  He will be closely reassessed.  Independent labs interpretation:  The following labs were independently interpreted: Within normal range  Independent visualization of imaging: - I independently visualized the following  imaging with scope of interpretation limited to determining acute life threatening conditions related to emergency care: Chest x-ray, which revealed no acute disease  Consultation: - Consulted or discussed management/test interpretation w/ external professional: N/A  Consideration for admission or further workup: Patient has no emergent conditions requiring admission or further work-up at this time and is stable for discharge home with primary care follow-up Social Determinants of health: N/A    Amount and/or Complexity of Data Reviewed Labs: ordered. Radiology: ordered.  Risk OTC drugs.          Final Clinical Impression(s) / ED Diagnoses Final diagnoses:  Nonspecific chest pain    Rx / DC Orders ED Discharge Orders     None         Rexford Maus, DO 05/27/22 0945

## 2022-06-19 ENCOUNTER — Encounter: Payer: Self-pay | Admitting: *Deleted

## 2022-07-28 ENCOUNTER — Encounter: Payer: Self-pay | Admitting: Physician Assistant

## 2022-07-28 ENCOUNTER — Ambulatory Visit (INDEPENDENT_AMBULATORY_CARE_PROVIDER_SITE_OTHER): Payer: 59 | Admitting: Physician Assistant

## 2022-07-28 VITALS — BP 126/80 | HR 67 | Temp 97.7°F | Ht 68.0 in | Wt 176.8 lb

## 2022-07-28 DIAGNOSIS — F331 Major depressive disorder, recurrent, moderate: Secondary | ICD-10-CM | POA: Diagnosis not present

## 2022-07-28 DIAGNOSIS — F411 Generalized anxiety disorder: Secondary | ICD-10-CM

## 2022-07-28 DIAGNOSIS — I10 Essential (primary) hypertension: Secondary | ICD-10-CM | POA: Diagnosis not present

## 2022-07-28 MED ORDER — LOSARTAN POTASSIUM 25 MG PO TABS: ORAL_TABLET | ORAL | 3 refills | Status: DC

## 2022-07-28 MED ORDER — BUPROPION HCL ER (XL) 300 MG PO TB24: 300.0000 mg | ORAL_TABLET | Freq: Every day | ORAL | 3 refills | Status: DC

## 2022-07-28 NOTE — Assessment & Plan Note (Signed)
Currently stable on Wellbutrin XL 300 mg once daily.  Refilled this medication today. 

## 2022-07-28 NOTE — Assessment & Plan Note (Signed)
Normotensive.  Stable on losartan 25 mg once daily.  Refilled this medication.  Encouraged continue to work on cardiovascular exercise.

## 2022-07-28 NOTE — Progress Notes (Signed)
Subjective:    Patient ID: Alejandro Weber, male    DOB: 1986/02/18, 37 y.o.   MRN: [id number]  Chief Complaint  Patient presents with   Follow-up    Pt in office for 6 mon f/u; pt states things are going well; pt taking Losartan and says he thinks it is working well, hoping it is keeping bp in range.     HPI Patient is in today for 6 month f/up GAD, MDD, HTN. Taking medications as directed. No other questions or concerns.   Interim hx: ED visit on 05/27/22 - chest pain; work-up negative; felt like this was stress related; no pain or issues since then  Past Medical History:  Diagnosis Date   Anxiety    History of chicken pox    childhood   History of kidney stones 07/07/2004   Hypertension     Past Surgical History:  Procedure Laterality Date   NO PAST SURGERIES  12/22/2011   VASECTOMY Bilateral 11/2020    Family History  Problem Relation Age of Onset   Hyperlipidemia Mother        Father,  MGM   Heart disease Father 64       cad-angioplasty   Hypertension Father        MGM   Esophageal cancer Father    Colon cancer Paternal Grandfather 35   Prostate cancer Maternal Grandfather    Coronary artery disease Maternal Grandfather    Diabetes Paternal Grandmother     Social History   Tobacco Use   Smoking status: Never   Smokeless tobacco: Never  Vaping Use   Vaping Use: Never used  Substance Use Topics   Alcohol use: No    Alcohol/week: 0.0 standard drinks of alcohol   Drug use: No     Allergies  Allergen Reactions   Amoxicillin Hives    Review of Systems NEGATIVE UNLESS OTHERWISE INDICATED IN HPI      Objective:     BP 126/80 (BP Location: Left Arm)   Pulse 67   Temp 97.7 F (36.5 C) (Temporal)   Ht 5\' 8"  (1.727 m)   Wt 176 lb 12.8 oz (80.2 kg)   SpO2 100%   BMI 26.88 kg/m   Wt Readings from Last 3 Encounters:  07/28/22 176 lb 12.8 oz (80.2 kg)  05/27/22 163 lb 11.2 oz (74.3 kg)  01/24/22 155 lb 6.4 oz (70.5 kg)    BP Readings from  Last 3 Encounters:  07/28/22 126/80  05/27/22 (!) 161/115  01/24/22 (!) 135/93     Physical Exam Vitals and nursing note reviewed.  Constitutional:      Appearance: Normal appearance.  Cardiovascular:     Rate and Rhythm: Normal rate and regular rhythm.     Pulses: Normal pulses.     Heart sounds: Normal heart sounds. No murmur heard. Pulmonary:     Effort: Pulmonary effort is normal.     Breath sounds: Normal breath sounds.  Musculoskeletal:     Right lower leg: No edema.     Left lower leg: No edema.  Skin:    Findings: No rash.  Neurological:     General: No focal deficit present.     Mental Status: He is alert and oriented to person, place, and time.  Psychiatric:        Mood and Affect: Mood normal.        Behavior: Behavior normal.        Assessment & Plan:  GAD (generalized anxiety  disorder) Assessment & Plan: Currently stable on Wellbutrin XL 300 mg once daily.  Refilled this medication today.   Moderate episode of recurrent major depressive disorder (HCC) Assessment & Plan: Currently stable on Wellbutrin XL 300 mg once daily.  Refilled this medication today.   Essential hypertension Assessment & Plan: Normotensive.  Stable on losartan 25 mg once daily.  Refilled this medication.  Encouraged continue to work on cardiovascular exercise.   Other orders -     buPROPion HCl ER (XL); Take 1 tablet (300 mg total) by mouth daily.  Dispense: 90 tablet; Refill: 3 -     Losartan Potassium; TAKE 1 TABLET BY MOUTH DAILY  Dispense: 90 tablet; Refill: 3        Return in about 6 months (around 01/26/2023) for CPE, fasting labs, med recheck .  This note was prepared with assistance of Systems analyst. Occasional wrong-word or sound-a-like substitutions may have occurred due to the inherent limitations of voice recognition software.     Diannah Rindfleisch M Wojciech Willetts, PA-C

## 2022-07-28 NOTE — Assessment & Plan Note (Signed)
Currently stable on Wellbutrin XL 300 mg once daily.  Refilled this medication today.

## 2022-11-09 ENCOUNTER — Other Ambulatory Visit: Payer: Self-pay | Admitting: Physician Assistant

## 2022-11-09 DIAGNOSIS — F411 Generalized anxiety disorder: Secondary | ICD-10-CM

## 2022-11-25 ENCOUNTER — Telehealth: Payer: 59 | Admitting: Family Medicine

## 2023-01-27 ENCOUNTER — Encounter: Payer: Self-pay | Admitting: Physician Assistant

## 2023-01-27 ENCOUNTER — Ambulatory Visit (INDEPENDENT_AMBULATORY_CARE_PROVIDER_SITE_OTHER): Payer: 59 | Admitting: Physician Assistant

## 2023-01-27 VITALS — BP 136/78 | HR 85 | Temp 97.3°F | Ht 68.0 in | Wt 180.0 lb

## 2023-01-27 DIAGNOSIS — Z0000 Encounter for general adult medical examination without abnormal findings: Secondary | ICD-10-CM

## 2023-01-27 LAB — CBC WITH DIFFERENTIAL/PLATELET
Basophils Absolute: 0 10*3/uL (ref 0.0–0.1)
Basophils Relative: 0.3 % (ref 0.0–3.0)
Eosinophils Absolute: 0.2 10*3/uL (ref 0.0–0.7)
Eosinophils Relative: 2.6 % (ref 0.0–5.0)
HCT: 43.6 % (ref 39.0–52.0)
Hemoglobin: 15 g/dL (ref 13.0–17.0)
Lymphocytes Relative: 28.3 % (ref 12.0–46.0)
Lymphs Abs: 2.1 10*3/uL (ref 0.7–4.0)
MCHC: 34.4 g/dL (ref 30.0–36.0)
MCV: 90.5 fl (ref 78.0–100.0)
Monocytes Absolute: 0.7 10*3/uL (ref 0.1–1.0)
Monocytes Relative: 8.9 % (ref 3.0–12.0)
Neutro Abs: 4.4 10*3/uL (ref 1.4–7.7)
Neutrophils Relative %: 59.9 % (ref 43.0–77.0)
Platelets: 325 10*3/uL (ref 150.0–400.0)
RBC: 4.82 Mil/uL (ref 4.22–5.81)
RDW: 13.2 % (ref 11.5–15.5)
WBC: 7.4 10*3/uL (ref 4.0–10.5)

## 2023-01-27 LAB — COMPREHENSIVE METABOLIC PANEL
ALT: 16 U/L (ref 0–53)
AST: 19 U/L (ref 0–37)
Albumin: 4.8 g/dL (ref 3.5–5.2)
Alkaline Phosphatase: 56 U/L (ref 39–117)
BUN: 19 mg/dL (ref 6–23)
CO2: 25 mEq/L (ref 19–32)
Calcium: 9.7 mg/dL (ref 8.4–10.5)
Chloride: 105 mEq/L (ref 96–112)
Creatinine, Ser: 1.13 mg/dL (ref 0.40–1.50)
GFR: 82.57 mL/min (ref >60.00)
Glucose, Bld: 104 mg/dL — ABNORMAL HIGH (ref 70–99)
Potassium: 4.1 mEq/L (ref 3.5–5.1)
Sodium: 138 mEq/L (ref 135–145)
Total Bilirubin: 0.6 mg/dL (ref 0.2–1.2)
Total Protein: 7.7 g/dL (ref 6.0–8.3)

## 2023-01-27 LAB — LIPID PANEL
Cholesterol: 246 mg/dL — ABNORMAL HIGH (ref 0–200)
HDL: 58.2 mg/dL (ref >39.00)
LDL Cholesterol: 155 mg/dL — ABNORMAL HIGH (ref 0–99)
NonHDL: 187.91
Total CHOL/HDL Ratio: 4
Triglycerides: 164 mg/dL — ABNORMAL HIGH (ref 0.0–149.0)
VLDL: 32.8 mg/dL (ref 0.0–40.0)

## 2023-01-27 LAB — TSH: TSH: 1.12 u[IU]/mL (ref 0.35–5.50)

## 2023-01-27 NOTE — Patient Instructions (Signed)
Keep working on good lifestyle! Glad to see you! Call if any concerns.

## 2023-01-27 NOTE — Progress Notes (Signed)
Subjective:    Patient ID: Alejandro Weber, male    DOB: [birth year], 37 y.o.   MRN: [id number]  Chief Complaint  Patient presents with   Annual Exam    HPI Patient is in today for annual CPE.  Acute concerns: None   Health maintenance: Lifestyle/ exercise: Running around after kids  Nutrition: Doing well  Mental health: Stable, doing well  Sleep: Doing good  Substance use: None  Sexual activity: Monogamous  Immunizations: UTD   Past Medical History:  Diagnosis Date   Anxiety    History of chicken pox    childhood   History of kidney stones 07/07/2004   Hypertension     Past Surgical History:  Procedure Laterality Date   NO PAST SURGERIES  12/22/2011   VASECTOMY Bilateral 11/2020    Family History  Problem Relation Age of Onset   Hyperlipidemia Mother        Father,  MGM   Heart disease Father 14       cad-angioplasty   Hypertension Father        MGM   Esophageal cancer Father    Colon cancer Paternal Grandfather 42   Prostate cancer Maternal Grandfather    Coronary artery disease Maternal Grandfather    Diabetes Paternal Grandmother     Social History   Tobacco Use   Smoking status: Never   Smokeless tobacco: Never  Vaping Use   Vaping status: Never Used  Substance Use Topics   Alcohol use: No   Drug use: No     Allergies  Allergen Reactions   Amoxicillin Hives    Review of Systems NEGATIVE UNLESS OTHERWISE INDICATED IN HPI      Objective:     BP 136/78   Pulse 85   Temp (!) 97.3 F (36.3 C) (Temporal)   Ht 5\' 8"  (1.727 m)   Wt 180 lb (81.6 kg)   SpO2 95%   BMI 27.37 kg/m   Wt Readings from Last 3 Encounters:  01/27/23 180 lb (81.6 kg)  07/28/22 176 lb 12.8 oz (80.2 kg)  05/27/22 163 lb 11.2 oz (74.3 kg)    BP Readings from Last 3 Encounters:  01/27/23 136/78  07/28/22 126/80  05/27/22 (!) 161/115     Physical Exam Vitals and nursing note reviewed.  Constitutional:      General: He is not in acute distress.     Appearance: Normal appearance. He is not toxic-appearing.  HENT:     Head: Normocephalic and atraumatic.     Right Ear: Tympanic membrane, ear canal and external ear normal.     Left Ear: Tympanic membrane, ear canal and external ear normal.     Nose: Nose normal.     Mouth/Throat:     Mouth: Mucous membranes are moist.     Pharynx: Oropharynx is clear.  Eyes:     Extraocular Movements: Extraocular movements intact.     Conjunctiva/sclera: Conjunctivae normal.     Pupils: Pupils are equal, round, and reactive to light.  Cardiovascular:     Rate and Rhythm: Normal rate and regular rhythm.     Pulses: Normal pulses.     Heart sounds: Normal heart sounds.  Pulmonary:     Effort: Pulmonary effort is normal.     Breath sounds: Normal breath sounds.  Abdominal:     General: Abdomen is flat. Bowel sounds are normal.     Palpations: Abdomen is soft.     Tenderness: There is no abdominal tenderness.  Musculoskeletal:        General: Normal range of motion.     Cervical back: Normal range of motion and neck supple.  Skin:    General: Skin is warm and dry.     Findings: No lesion.  Neurological:     General: No focal deficit present.     Mental Status: He is alert and oriented to person, place, and time.  Psychiatric:        Mood and Affect: Mood normal.        Behavior: Behavior normal.        Assessment & Plan:  Encounter for annual physical exam -     CBC with Differential/Platelet -     Comprehensive metabolic panel -     Lipid panel -     TSH   Age-appropriate screening and counseling performed today. Will check labs and call with results. Preventive measures discussed and printed in AVS for patient.   Patient Counseling: [x]   Nutrition: Stressed importance of moderation in sodium/caffeine intake, saturated fat and cholesterol, caloric balance, sufficient intake of fresh fruits, vegetables, and fiber.  [x]   Stressed the importance of regular exercise.   []   Substance  Abuse: Discussed cessation/primary prevention of tobacco, alcohol, or other drug use; driving or other dangerous activities under the influence; availability of treatment for abuse.   []   Injury prevention: Discussed safety belts, safety helmets, smoke detector, smoking near bedding or upholstery.   []   Sexuality: Discussed sexually transmitted diseases, partner selection, use of condoms, avoidance of unintended pregnancy  and contraceptive alternatives.   [x]   Dental health: Discussed importance of regular tooth brushing, flossing, and dental visits.  [x]   Health maintenance and immunizations reviewed. Please refer to Health maintenance section.        Return in about 1 year (around 01/27/2024) for physical.    Amiah Frohlich M Gean Larose, PA-C

## 2023-03-01 ENCOUNTER — Encounter (HOSPITAL_COMMUNITY): Payer: Self-pay

## 2023-03-01 ENCOUNTER — Ambulatory Visit (HOSPITAL_COMMUNITY)
Admission: EM | Admit: 2023-03-01 | Discharge: 2023-03-01 | Disposition: A | Discharge status: Home / Self Care | Payer: 59 | Attending: Family Medicine | Admitting: Family Medicine

## 2023-03-01 DIAGNOSIS — I889 Nonspecific lymphadenitis, unspecified: Secondary | ICD-10-CM

## 2023-03-01 DIAGNOSIS — H60311 Diffuse otitis externa, right ear: Secondary | ICD-10-CM

## 2023-03-01 HISTORY — DX: Disorder of kidney and ureter, unspecified: N28.9

## 2023-03-01 MED ORDER — DOXYCYCLINE HYCLATE 100 MG PO CAPS: 100.0000 mg | ORAL_CAPSULE | Freq: Two times a day (BID) | ORAL | 0 refills | Status: AC

## 2023-03-01 MED ORDER — OFLOXACIN 0.3 % OT SOLN: 5.0000 [drp] | Freq: Two times a day (BID) | OTIC | 0 refills | Status: AC

## 2023-03-01 NOTE — Discharge Instructions (Addendum)
-  Floxin eardrops-5 drops in the affected ear 2 times daily for 7 days.  Take doxycycline 100 mg --1 capsule 2 times daily for 5 days

## 2023-03-01 NOTE — ED Provider Notes (Signed)
[facility]    CSN: [id number] Arrival date & time: 03/01/23  1009      History   Chief Complaint Chief Complaint  Patient presents with   Otalgia    HPI Alejandro Weber is a 37 y.o. male.    Otalgia  Here for right ear pain.  Is been going on for about a week.  He has not had any fever or cough or congestion.  Ibuprofen 600 mg has been helping pretty well.  He is allergic to amoxicillin.  They causes hives  Past Medical History:  Diagnosis Date   Anxiety    History of chicken pox    childhood   History of kidney stones 07/07/2004   Hypertension    Renal disorder     Patient Active Problem List   Diagnosis Date Noted   Essential hypertension 09/20/2021   Mild obstructive sleep apnea-hypopnea syndrome 08/30/2021   Moderate episode of recurrent major depressive disorder (HCC) 07/29/2021   Snoring 07/29/2021   Status post vasectomy 02/06/2021   Orchitis and epididymitis 02/06/2021   GAD (generalized anxiety disorder) 09/26/2019   Family history of early CAD 01/27/2018   Gastroesophageal reflux disease without esophagitis 01/27/2018   Visit for preventive health examination 05/28/2015    Past Surgical History:  Procedure Laterality Date   NO PAST SURGERIES  12/22/2011   VASECTOMY Bilateral 11/2020       Home Medications    Prior to Admission medications   Medication Sig Start Date End Date Taking? Authorizing Provider  doxycycline (VIBRAMYCIN) 100 MG capsule Take 1 capsule (100 mg total) by mouth 2 (two) times daily for 5 days. 03/01/23 03/06/23 Yes Exavier Lina, Janace Aris, MD  ofloxacin (FLOXIN) 0.3 % OTIC solution Place 5 drops into both ears 2 (two) times daily for 7 days. 03/01/23 03/08/23 Yes Zenia Resides, MD  buPROPion (WELLBUTRIN XL) 300 MG 24 hr tablet Take 1 tablet (300 mg total) by mouth daily. 07/28/22   Allwardt, Alyssa M, PA-C  escitalopram (LEXAPRO) 10 MG tablet Take 10 mg by mouth daily. 09/15/22   [provider]   fluticasone (FLONASE) 50 MCG/ACT nasal spray Place 2 sprays into both nostrils daily. 02/09/20   Waldon Merl, PA-C  levocetirizine (XYZAL) 5 MG tablet Take 1 tablet (5 mg total) by mouth every evening. 02/09/20   Waldon Merl, PA-C  losartan (COZAAR) 25 MG tablet TAKE 1 TABLET BY MOUTH DAILY 07/28/22   Allwardt, Crist Infante, PA-C  Multiple Vitamins-Minerals (MENS MULTIVITAMIN PLUS PO) Take by mouth daily.    [provider]    Family History Family History  Problem Relation Age of Onset   Hyperlipidemia Mother        Father,  MGM   Heart disease Father 31       cad-angioplasty   Hypertension Father        MGM   Esophageal cancer Father    Colon cancer Paternal Grandfather 32   Prostate cancer Maternal Grandfather    Coronary artery disease Maternal Grandfather    Diabetes Paternal Grandmother     Social History Social History   Tobacco Use   Smoking status: Never   Smokeless tobacco: Never  Vaping Use   Vaping status: Never Used  Substance Use Topics   Alcohol use: No   Drug use: No     Allergies   Amoxicillin   Review of Systems Review of Systems  HENT:  Positive for ear pain.      Physical  Exam Triage Vital Signs ED Triage Vitals  Encounter Vitals Group     BP 03/01/23 1045 (!) 142/95     Systolic BP Percentile --      Diastolic BP Percentile --      Pulse Rate 03/01/23 1045 72     Resp 03/01/23 1045 14     Temp 03/01/23 1045 98 F (36.7 C)     Temp Source 03/01/23 1045 Oral     SpO2 03/01/23 1045 98 %     Weight --      Height --      Head Circumference --      Peak Flow --      Pain Score 03/01/23 1047 4     Pain Loc --      Pain Education --      Exclude from Growth Chart --    No data found.  Updated Vital Signs BP (!) 142/95 (BP Location: Right Arm)   Pulse 72   Temp 98 F (36.7 C) (Oral)   Resp 14   SpO2 98%   Visual Acuity Right Eye Distance:   Left Eye Distance:   Bilateral Distance:    Right Eye Near:   Left  Eye Near:    Bilateral Near:     Physical Exam Vitals reviewed.  Constitutional:      General: He is not in acute distress.    Appearance: He is not ill-appearing, toxic-appearing or diaphoretic.  HENT:     Ears:     Comments: Bilaterally both tympanic membranes are gray and shiny and translucent.  The right ear canal has either an abrasion or little blood on it.  There is no discharge at this time.  The floor of the right ear canal is possibly a little swollen.  There is pain on traction of the pinna and there is a little tenderness and possibly swelling in the neck just inferior to the right angle of the jaw.  There is no erythema there.    Mouth/Throat:     Mouth: Mucous membranes are moist.  Skin:    Coloration: Skin is not pale.  Neurological:     Mental Status: He is alert and oriented to person, place, and time.  Psychiatric:        Behavior: Behavior normal.      UC Treatments / Results  Labs (all labs ordered are listed, but only abnormal results are displayed) Labs Reviewed - No data to display  EKG   Radiology No results found.  Procedures Procedures (including critical care time)  Medications Ordered in UC Medications - No data to display  Initial Impression / Assessment and Plan / UC Course  I have reviewed the triage vital signs and the nursing notes.  Pertinent labs & imaging results that were available during my care of the patient were reviewed by me and considered in my medical decision making (see chart for details).        Floxin is sent in for possible OE, and doxy x 5 days for ?lymphadenitis.  He will continue the ibuprofen as needed Final Clinical Impressions(s) / UC Diagnoses   Final diagnoses:  Acute diffuse otitis externa of right ear  Lymphadenitis     Discharge Instructions      -Floxin eardrops-5 drops in the affected ear 2 times daily for 7 days.  Take doxycycline 100 mg --1 capsule 2 times daily for 5 days     ED  Prescriptions  Medication Sig Dispense Auth. Provider   ofloxacin (FLOXIN) 0.3 % OTIC solution Place 5 drops into both ears 2 (two) times daily for 7 days. 5 mL Zenia Resides, MD   doxycycline (VIBRAMYCIN) 100 MG capsule Take 1 capsule (100 mg total) by mouth 2 (two) times daily for 5 days. 10 capsule Zenia Resides, MD      PDMP not reviewed this encounter.   Zenia Resides, MD 03/01/23 [phone]

## 2023-03-01 NOTE — ED Triage Notes (Signed)
Patient c/o right ear pain x 1 week.  Patient states he has been taking Ibuprofen and last dose was at 0600 today.

## 2023-06-10 ENCOUNTER — Other Ambulatory Visit: Payer: Self-pay | Admitting: Physician Assistant

## 2023-07-21 ENCOUNTER — Ambulatory Visit: Payer: Self-pay | Admitting: Physician Assistant

## 2023-07-21 NOTE — Telephone Encounter (Signed)
  Chief Complaint: htn Symptoms: high BP Frequency: yesterday and most of today Pertinent Negatives: Patient denies CP, denies SOB, denies vision changes, denies HA, denies dizziness  Disposition: [] ED /[] Urgent Care (no appt availability in office) / [x] Appointment(In office/virtual)/ []  [facility]/ [] Home Care/ [] Refused Recommended Disposition /[] [facility]/ []  Follow-up with PCP  Additional Notes: Pt states that he had many readings yesterday with systolic 160s+ as well as diastolic 100+s.  Pt states that he has hx of depression and anxiety. States that he had chest pressure yesterday with the HTN episodes but that has subsided.  Pt given care instructions per the care advice in Epic. Pt understands and is agreeable. Pt advised to check BP 3 times per day until appt, agreeable. Pt sched with  PCP. Pt has been taking his losartan  as prescribed. Takes in morning, took this morning about 2 hours prior to call.   Copied from CRM [id number]. Topic: Clinical - Red Word Triage >> Jul 21, 2023  9:58 AM Deaijah H wrote: Red Word that prompted transfer to Nurse Triage: Blood pressure running high / 150/100 ( most recent ) Reason for Disposition  Systolic BP  >= 160 OR Diastolic >= 100  Answer Assessment - Initial Assessment Questions 1. BLOOD PRESSURE: What is the blood pressure? Did you take at least two measurements 5 minutes apart?     150/100, 156/100 2. ONSET: When did you take your blood pressure?     This morning 3. HOW: How did you take your blood pressure? (e.g., automatic home BP monitor, visiting nurse)     Using automatic  4. HISTORY: Do you have a history of high blood pressure?     Yes, takes losartan  5. MEDICINES: Are you taking any medicines for blood pressure? Have you missed any doses recently?     Taking losartan  as rx'd 6. OTHER SYMPTOMS: Do you have any symptoms? (e.g., blurred vision, chest pain, difficulty breathing, headache,  weakness)     Last night had some chest discomfort, this has subsided.  Protocols used: Blood Pressure - High-A-AH

## 2023-07-21 NOTE — Telephone Encounter (Signed)
 Noted and agreed, thank you.

## 2023-07-23 ENCOUNTER — Ambulatory Visit (INDEPENDENT_AMBULATORY_CARE_PROVIDER_SITE_OTHER): Payer: BC Managed Care – PPO | Admitting: Physician Assistant

## 2023-07-23 ENCOUNTER — Encounter: Payer: Self-pay | Admitting: Physician Assistant

## 2023-07-23 VITALS — BP 142/94 | HR 108 | Temp 97.5°F | Ht 68.0 in | Wt 184.2 lb

## 2023-07-23 DIAGNOSIS — Z8249 Family history of ischemic heart disease and other diseases of the circulatory system: Secondary | ICD-10-CM | POA: Diagnosis not present

## 2023-07-23 DIAGNOSIS — I10 Essential (primary) hypertension: Secondary | ICD-10-CM

## 2023-07-23 DIAGNOSIS — K219 Gastro-esophageal reflux disease without esophagitis: Secondary | ICD-10-CM | POA: Diagnosis not present

## 2023-07-23 MED ORDER — OLMESARTAN MEDOXOMIL-HCTZ 20-12.5 MG PO TABS: 1.0000 | ORAL_TABLET | Freq: Every day | ORAL | 0 refills | Status: DC

## 2023-07-23 NOTE — Progress Notes (Signed)
Patient ID: Alejandro Weber, male    DOB: Nov 22, 1985, 38 y.o.   MRN: [id number]   Assessment & Plan:  Essential hypertension -     Olmesartan Medoxomil-HCTZ; Take 1 tablet by mouth daily.  Dispense: 30 tablet; Refill: 0  Family history of early CAD  Gastroesophageal reflux disease without esophagitis     Assessment and Plan    Hypertension Elevated blood pressure readings over the past month, despite adherence to Losartan. Family history of cardiac disease. -Switch from Losartan to Olmesartan with Hydrochlorothiazide for better blood pressure control. -Check blood pressure in 4 weeks to assess response to new medication.  Chest Discomfort Occasional chest discomfort, possibly related to acid reflux. No current chest pain. No recent cardiology consult despite family history of cardiac disease. -Start over-the-counter Nexium for 2 weeks to manage possible acid reflux. -Consider referral to cardiology for risk stratification and possible stress test, given family history and occasional chest discomfort.  Lifestyle Modifications Patient has been trying to improve diet and exercise habits. -Encourage continuation of healthy lifestyle changes, including regular cardiovascular exercise.         Return in about 4 weeks (around 08/20/2023) for blood pressure check.    Subjective:    Chief Complaint  Patient presents with   Hypertension    Pt in office with c/o BP being worse; pt has readings with him to discuss; taking bp meds every morning    HPI Discussed the use of AI scribe software for clinical note transcription with the patient, who gave verbal consent to proceed.  History of Present Illness   The patient, a 38 year old with a family history of heart disease, presents with a recent increase in blood pressure readings. He reports that he has been monitoring his blood pressure more frequently due to occasional feelings of being "off," which he associates with elevated  blood pressure. Over the past few weeks, these episodes have become more continuous. He denies any significant changes in lifestyle or diet, and has been making efforts to eat healthier and exercise more. He also reports a decrease in coffee consumption. He denies any increase in alcohol consumption. He reports normal work and life stress.  Over a year ago, the patient experienced chest discomfort and sought emergency care. An EKG was performed and results were normal. Recently, he has experienced some chest discomfort, which he attributes to possible acid reflux or chest gout. He has been managing the discomfort with over-the-counter antacids.       Past Medical History:  Diagnosis Date   Anxiety    History of chicken pox    childhood   History of kidney stones 07/07/2004   Hypertension    Renal disorder     Past Surgical History:  Procedure Laterality Date   NO PAST SURGERIES  12/22/2011   VASECTOMY Bilateral 11/2020    Family History  Problem Relation Age of Onset   Hyperlipidemia Mother        Father,  MGM   Heart disease Father 77       cad-angioplasty   Hypertension Father        MGM   Esophageal cancer Father    Colon cancer Paternal Grandfather 27   Prostate cancer Maternal Grandfather    Coronary artery disease Maternal Grandfather    Diabetes Paternal Grandmother     Social History   Tobacco Use   Smoking status: Never   Smokeless tobacco: Never  Vaping Use   Vaping status: Never Used  Substance Use Topics   Alcohol use: No   Drug use: No     Allergies  Allergen Reactions   Amoxicillin Hives    Review of Systems NEGATIVE UNLESS OTHERWISE INDICATED IN HPI      Objective:     BP (!) 142/94   Pulse (!) 108   Temp (!) 97.5 F (36.4 C) (Temporal)   Ht 5\' 8"  (1.727 m)   Wt 184 lb 3.2 oz (83.6 kg)   SpO2 97%   BMI 28.01 kg/m   Wt Readings from Last 3 Encounters:  07/23/23 184 lb 3.2 oz (83.6 kg)  01/27/23 180 lb (81.6 kg)  07/28/22 176 lb  12.8 oz (80.2 kg)    BP Readings from Last 3 Encounters:  07/23/23 (!) 142/94  03/01/23 (!) 142/95  01/27/23 136/78     Physical Exam Vitals and nursing note reviewed.  Constitutional:      Appearance: Normal appearance.  Cardiovascular:     Rate and Rhythm: Normal rate and regular rhythm.     Pulses: Normal pulses.     Heart sounds: Normal heart sounds. No murmur heard. Pulmonary:     Effort: Pulmonary effort is normal.     Breath sounds: Normal breath sounds.  Musculoskeletal:     Right lower leg: No edema.     Left lower leg: No edema.  Skin:    Findings: No rash.  Neurological:     General: No focal deficit present.     Mental Status: He is alert and oriented to person, place, and time.  Psychiatric:        Mood and Affect: Mood normal.        Behavior: Behavior normal.         Raelene Trew M Deleah Tison, PA-C

## 2023-08-13 ENCOUNTER — Encounter: Payer: Self-pay | Admitting: Physician Assistant

## 2023-08-13 NOTE — Telephone Encounter (Signed)
 Ok to place referral to Cardiology

## 2023-08-14 ENCOUNTER — Other Ambulatory Visit: Payer: Self-pay

## 2023-08-14 DIAGNOSIS — Z8249 Family history of ischemic heart disease and other diseases of the circulatory system: Secondary | ICD-10-CM

## 2023-08-14 DIAGNOSIS — I10 Essential (primary) hypertension: Secondary | ICD-10-CM

## 2023-08-17 ENCOUNTER — Encounter: Payer: Self-pay | Admitting: Physician Assistant

## 2023-08-17 NOTE — Telephone Encounter (Signed)
 Please see pt msg and advise if patient still needs to keep appt

## 2023-08-20 ENCOUNTER — Ambulatory Visit: Payer: BC Managed Care – PPO | Admitting: Physician Assistant

## 2023-08-24 ENCOUNTER — Other Ambulatory Visit: Payer: Self-pay | Admitting: Physician Assistant

## 2023-08-24 ENCOUNTER — Other Ambulatory Visit: Payer: Self-pay

## 2023-08-24 DIAGNOSIS — I10 Essential (primary) hypertension: Secondary | ICD-10-CM

## 2023-08-24 MED ORDER — OLMESARTAN MEDOXOMIL-HCTZ 20-12.5 MG PO TABS: 1.0000 | ORAL_TABLET | Freq: Every day | ORAL | 0 refills | Status: DC

## 2023-09-21 ENCOUNTER — Other Ambulatory Visit: Payer: Self-pay | Admitting: Physician Assistant

## 2023-09-21 DIAGNOSIS — I10 Essential (primary) hypertension: Secondary | ICD-10-CM

## 2023-09-30 ENCOUNTER — Telehealth: Payer: Self-pay

## 2023-09-30 NOTE — Telephone Encounter (Signed)
 Called pt stated he is sob at times but I believe due to lack of excises and also states he is doing fine over all no big changes since visit. Just stressed at times.

## 2023-09-30 NOTE — Telephone Encounter (Signed)
 Please see note below and advise next step.  Copied from CRM [id number]. Topic: General - Other >> Sep 30, 2023 10:20 AM Tiffany S wrote: Reason for CRM: Patient is asking for referral to Heart care cardiologist the provider the patient was referred to does not have availability until May and he needs an appt a lot sooner than that. Please follow up with patient.

## 2023-10-01 NOTE — Telephone Encounter (Signed)
 Called pt back he stated he was glad to know he could call get put on waiting list for sooner if possible. Stated he would make appt if he needed to before the appt he has with heart dr.

## 2023-10-20 ENCOUNTER — Other Ambulatory Visit: Payer: Self-pay | Admitting: Physician Assistant

## 2023-10-20 DIAGNOSIS — I10 Essential (primary) hypertension: Secondary | ICD-10-CM

## 2023-11-02 ENCOUNTER — Other Ambulatory Visit: Payer: Self-pay | Admitting: Physician Assistant

## 2023-11-02 NOTE — Telephone Encounter (Signed)
 Copied from CRM [id number]. Topic: Clinical - Medication Refill >> Nov 02, 2023  2:20 PM Emylou G wrote: Most Recent Primary Care Visit:  Provider: ALLWARDT, ALYSSA M  Department: [facility]  Visit Type: ACUTE  Date: 07/23/2023  Medication: escitalopram  (LEXAPRO ) 10 MG tablet  Has the patient contacted their pharmacy? No (Agent: If no, request that the patient contact the pharmacy for the refill. If patient does not wish to contact the pharmacy document the reason why and proceed with request.) (Agent: If yes, when and what did the pharmacy advise?)  Is this the correct pharmacy for this prescription? Yes If no, delete pharmacy and type the correct one.  This is the patient's preferred pharmacy:  [id number] - [place], [place] - [address] [place] [address] Phone: [phone] Fax: [phone]   Has the prescription been filled recently? No  Is the patient out of the medication? Yes  Has the patient been seen for an appointment in the last year OR does the patient have an upcoming appointment? Yes  Can we respond through MyChart? Yes  Agent: Please be advised that Rx refills may take up to 3 business days. We ask that you follow-up with your pharmacy.

## 2023-11-03 MED ORDER — ESCITALOPRAM OXALATE 10 MG PO TABS: 10.0000 mg | ORAL_TABLET | Freq: Every day | ORAL | 2 refills | Status: DC

## 2023-11-03 NOTE — Telephone Encounter (Signed)
 Please advise on refill for patient, prev filled by PCP

## 2023-11-17 ENCOUNTER — Other Ambulatory Visit: Payer: Self-pay | Admitting: Physician Assistant

## 2023-11-17 ENCOUNTER — Ambulatory Visit: Payer: BC Managed Care – PPO | Admitting: Cardiology

## 2023-11-17 DIAGNOSIS — I10 Essential (primary) hypertension: Secondary | ICD-10-CM

## 2023-11-24 ENCOUNTER — Other Ambulatory Visit: Payer: Self-pay

## 2023-11-24 ENCOUNTER — Other Ambulatory Visit: Payer: Self-pay | Admitting: Physician Assistant

## 2023-11-24 MED ORDER — BUPROPION HCL ER (XL) 300 MG PO TB24: 300.0000 mg | ORAL_TABLET | Freq: Every day | ORAL | 3 refills | Status: AC

## 2023-11-24 MED ORDER — BUPROPION HCL ER (XL) 300 MG PO TB24: 300.0000 mg | ORAL_TABLET | Freq: Every day | ORAL | 3 refills | Status: DC

## 2023-11-24 NOTE — Telephone Encounter (Signed)
 Copied from CRM [id number]. Topic: Clinical - Medication Refill >> Nov 24, 2023 10:56 AM Jenice Mitts wrote: Medication:  buPROPion  (WELLBUTRIN  XL) 300 MG 24 hr tablet   Has the patient contacted their pharmacy? Yes (Agent: If no, request that the patient contact the pharmacy for the refill. If patient does not wish to contact the pharmacy document the reason why and proceed with request.) (Agent: If yes, when and what did the pharmacy advise?)  This is the patient's preferred pharmacy:  [id number] - [place], [place] - [address] [place] [address] Phone: [phone] Fax: [phone]   Is this the correct pharmacy for this prescription? Yes If no, delete pharmacy and type the correct one.   Has the prescription been filled recently? No  Is the patient out of the medication? Yes  Has the patient been seen for an appointment in the last year OR does the patient have an upcoming appointment? Yes  Can we respond through MyChart? Yes  Agent: Please be advised that Rx refills may take up to 3 business days. We ask that you follow-up with your pharmacy.

## 2023-12-16 ENCOUNTER — Other Ambulatory Visit: Payer: Self-pay | Admitting: Physician Assistant

## 2023-12-16 DIAGNOSIS — I10 Essential (primary) hypertension: Secondary | ICD-10-CM

## 2024-01-01 ENCOUNTER — Other Ambulatory Visit: Payer: Self-pay | Admitting: Physician Assistant

## 2024-01-01 ENCOUNTER — Other Ambulatory Visit: Payer: Self-pay

## 2024-01-12 ENCOUNTER — Encounter: Payer: Self-pay | Admitting: Physician Assistant

## 2024-01-12 ENCOUNTER — Other Ambulatory Visit: Payer: Self-pay

## 2024-01-12 ENCOUNTER — Other Ambulatory Visit: Payer: Self-pay | Admitting: Physician Assistant

## 2024-01-12 DIAGNOSIS — I10 Essential (primary) hypertension: Secondary | ICD-10-CM

## 2024-01-12 MED ORDER — OLMESARTAN MEDOXOMIL-HCTZ 20-12.5 MG PO TABS: 1.0000 | ORAL_TABLET | Freq: Every day | ORAL | 0 refills | Status: DC

## 2024-01-28 ENCOUNTER — Encounter: Payer: 59 | Admitting: Physician Assistant

## 2024-01-28 ENCOUNTER — Ambulatory Visit: Admitting: Physician Assistant

## 2024-01-28 ENCOUNTER — Encounter: Payer: Self-pay | Admitting: Physician Assistant

## 2024-01-28 VITALS — BP 120/82 | HR 80 | Temp 97.2°F | Ht 68.0 in | Wt 193.2 lb

## 2024-01-28 DIAGNOSIS — E782 Mixed hyperlipidemia: Secondary | ICD-10-CM | POA: Diagnosis not present

## 2024-01-28 DIAGNOSIS — Z0000 Encounter for general adult medical examination without abnormal findings: Secondary | ICD-10-CM

## 2024-01-28 DIAGNOSIS — Z8249 Family history of ischemic heart disease and other diseases of the circulatory system: Secondary | ICD-10-CM | POA: Diagnosis not present

## 2024-01-28 DIAGNOSIS — I10 Essential (primary) hypertension: Secondary | ICD-10-CM

## 2024-01-28 DIAGNOSIS — F411 Generalized anxiety disorder: Secondary | ICD-10-CM

## 2024-01-28 LAB — CBC WITH DIFFERENTIAL/PLATELET
Basophils Absolute: 0 K/uL (ref 0.0–0.1)
Basophils Relative: 0.4 % (ref 0.0–3.0)
Eosinophils Absolute: 0.1 K/uL (ref 0.0–0.7)
Eosinophils Relative: 1.9 % (ref 0.0–5.0)
HCT: 42.1 % (ref 39.0–52.0)
Hemoglobin: 14.4 g/dL (ref 13.0–17.0)
Lymphocytes Relative: 35.8 % (ref 12.0–46.0)
Lymphs Abs: 1.9 K/uL (ref 0.7–4.0)
MCHC: 34.3 g/dL (ref 30.0–36.0)
MCV: 89.7 fl (ref 78.0–100.0)
Monocytes Absolute: 0.6 K/uL (ref 0.1–1.0)
Monocytes Relative: 11.5 % (ref 3.0–12.0)
Neutro Abs: 2.7 K/uL (ref 1.4–7.7)
Neutrophils Relative %: 50.4 % (ref 43.0–77.0)
Platelets: 298 K/uL (ref 150.0–400.0)
RBC: 4.69 Mil/uL (ref 4.22–5.81)
RDW: 13.5 % (ref 11.5–15.5)
WBC: 5.3 K/uL (ref 4.0–10.5)

## 2024-01-28 LAB — COMPREHENSIVE METABOLIC PANEL WITH GFR
ALT: 23 U/L (ref 0–53)
AST: 20 U/L (ref 0–37)
Albumin: 4.8 g/dL (ref 3.5–5.2)
Alkaline Phosphatase: 59 U/L (ref 39–117)
BUN: 20 mg/dL (ref 6–23)
CO2: 28 meq/L (ref 19–32)
Calcium: 9.5 mg/dL (ref 8.4–10.5)
Chloride: 103 meq/L (ref 96–112)
Creatinine, Ser: 1.09 mg/dL (ref 0.40–1.50)
GFR: 85.62 mL/min (ref >60.00)
Glucose, Bld: 102 mg/dL — ABNORMAL HIGH (ref 70–99)
Potassium: 4.5 meq/L (ref 3.5–5.1)
Sodium: 139 meq/L (ref 135–145)
Total Bilirubin: 0.6 mg/dL (ref 0.2–1.2)
Total Protein: 7.6 g/dL (ref 6.0–8.3)

## 2024-01-28 LAB — LIPID PANEL
Cholesterol: 256 mg/dL — ABNORMAL HIGH (ref 0–200)
HDL: 55.6 mg/dL (ref >39.00)
LDL Cholesterol: 171 mg/dL — ABNORMAL HIGH (ref 0–99)
NonHDL: 200.83
Total CHOL/HDL Ratio: 5
Triglycerides: 149 mg/dL (ref 0.0–149.0)
VLDL: 29.8 mg/dL (ref 0.0–40.0)

## 2024-01-28 LAB — HEMOGLOBIN A1C: Hgb A1c MFr Bld: 5.6 % (ref 4.6–6.5)

## 2024-01-28 LAB — TSH: TSH: 1.15 u[IU]/mL (ref 0.35–5.50)

## 2024-01-28 MED ORDER — ESCITALOPRAM OXALATE 10 MG PO TABS: 10.0000 mg | ORAL_TABLET | Freq: Every day | ORAL | 3 refills | Status: DC

## 2024-01-28 MED ORDER — OLMESARTAN MEDOXOMIL-HCTZ 20-12.5 MG PO TABS: 1.0000 | ORAL_TABLET | Freq: Every day | ORAL | 3 refills | Status: DC

## 2024-01-28 MED ORDER — OLMESARTAN MEDOXOMIL-HCTZ 20-12.5 MG PO TABS: 1.0000 | ORAL_TABLET | Freq: Every day | ORAL | 3 refills | Status: AC

## 2024-01-28 MED ORDER — ESCITALOPRAM OXALATE 10 MG PO TABS: 10.0000 mg | ORAL_TABLET | Freq: Every day | ORAL | 3 refills | Status: AC

## 2024-01-28 NOTE — Progress Notes (Signed)
 Patient ID: Alejandro Weber, male    DOB: [birth year], 38 y.o.   MRN: [id number]   Assessment & Plan:  Annual physical exam -     CBC with Differential/Platelet -     Comprehensive metabolic panel with GFR -     Hemoglobin A1c -     Lipid panel -     TSH  Essential hypertension -     Olmesartan  Medoxomil-HCTZ; Take 1 tablet by mouth daily.  Dispense: 90 tablet; Refill: 3  Mixed hyperlipidemia -     Lipid panel -     Lipoprotein A (LPA)  Family history of early CAD -     Lipoprotein A (LPA)  GAD (generalized anxiety disorder) -     Escitalopram  Oxalate; Take 1 tablet (10 mg total) by mouth daily.  Dispense: 90 tablet; Refill: 3      Assessment & Plan Hyperlipidemia Chronic hyperlipidemia with a family history of heart disease. Cholesterol levels have been persistently elevated. Discussed potential genetic component and utility of lipoprotein(a) testing for cardiology risk assessment. Elevated lipoprotein(a) may necessitate intensified cholesterol management to reduce cardiovascular risk. - Order lipoprotein(a) test  Hypertension Blood pressure is well-controlled with current medication regimen. Reports improvement with current treatment. - Continue current antihypertensive medication - Refill prescription for blood pressure medication  Sleep Apnea Previously diagnosed with mild to moderate sleep apnea. Reports difficulty using CPAP and experiences fatigue. Recent dental evaluation revealed a tongue tie, potentially contributing to sleep issues. Wisdom teeth require extraction, which may also impact sleep. Tongue tie release may improve sleep by allowing better tongue positioning. - Consider tongue tie release as recommended by dentist - Plan for wisdom teeth extraction  Generalized Anxiety Disorder Well-managed on Lexapro  10 mg daily. No changes in mental health status or medication efficacy. - Refill Lexapro  10 mg with 90 tablets and 3 refills  General Health  Maintenance Discussed family history of colon cancer and importance of screening. No current symptoms suggestive of colorectal issues. Prostate cancer screening to begin at age 22. Discussed skin tag removal options, noting it is considered cosmetic by insurance.  - Plan colon cancer screening at age 89 - Plan prostate cancer screening at age 63 - Consider home removal of skin tag or in-office procedure if desired  Follow-up Cardiology appointment scheduled for August 26th with Dr. Court. Emphasized importance of cardiology follow-up and potential additional testing. - Follow up with cardiology on August 26th - Schedule annual check-up in one year unless issues arise      Return in about 1 year (around 01/27/2025) for physical.    Subjective:    Chief Complaint  Patient presents with   Annual Exam    Pt in office for annual CPE and fasting labs;     HPI Discussed the use of AI scribe software for clinical note transcription with the patient, who gave verbal consent to proceed.  History of Present Illness Alejandro Weber is a 38 year old male who presents for a routine follow-up visit.  He has no new symptoms since his last visit. He is scheduled to see a cardiologist on August 26th. He has a family history of heart disease. His cholesterol levels have been high over the years, and he is considering additional blood work to assess genetic components.  He is currently taking Lexapro  10 mg daily, which helps maintain his baseline mental health. He also takes medication for hypertension, which he feels has been effective in controlling his blood pressure.  He  has a history of mild to moderate sleep apnea but finds it difficult to use the prescribed treatment. He recently discovered he has a tongue tie, which his dentist suggested might be contributing to his sleep issues. He also has wisdom teeth that need removal, with one causing discomfort as it is growing sideways.  He mentions  a skin tag on his neck that is bothersome but has not noticed any other skin changes or suspicious moles. No changes in bowel movements, no bleeding, no stomach pain, no recent surgeries or hospitalizations, no mental health concerns.     Past Medical History:  Diagnosis Date   Anxiety    Chronic kidney disease    History of chicken pox    childhood   History of kidney stones 07/07/2004   Hypertension    Renal disorder     Past Surgical History:  Procedure Laterality Date   NO PAST SURGERIES  12/22/2011   VASECTOMY Bilateral 11/2020    Family History  Problem Relation Age of Onset   Hyperlipidemia Mother        Father,  MGM   Heart disease Father 65       cad-angioplasty   Hypertension Father        MGM   Esophageal cancer Father    Colon cancer Paternal Grandfather 71   Prostate cancer Maternal Grandfather    Coronary artery disease Maternal Grandfather    Diabetes Paternal Grandmother    ADD / ADHD Son     Social History   Tobacco Use   Smoking status: Never   Smokeless tobacco: Never  Vaping Use   Vaping status: Never Used  Substance Use Topics   Alcohol use: No   Drug use: No     Allergies  Allergen Reactions   Amoxicillin Hives    Review of Systems NEGATIVE UNLESS OTHERWISE INDICATED IN HPI      Objective:     BP 120/82 (BP Location: Left Arm, Patient Position: Sitting, Cuff Size: Normal)   Pulse 80   Temp (!) 97.2 F (36.2 C) (Temporal)   Ht 5' 8 (1.727 m)   Wt 193 lb 3.2 oz (87.6 kg)   SpO2 98%   BMI 29.38 kg/m   Wt Readings from Last 3 Encounters:  01/28/24 193 lb 3.2 oz (87.6 kg)  07/23/23 184 lb 3.2 oz (83.6 kg)  01/27/23 180 lb (81.6 kg)    BP Readings from Last 3 Encounters:  01/28/24 120/82  07/23/23 (!) 142/94  03/01/23 (!) 142/95     Physical Exam Vitals and nursing note reviewed.  Constitutional:      General: He is not in acute distress.    Appearance: Normal appearance. He is not toxic-appearing.  HENT:      Head: Normocephalic and atraumatic.     Right Ear: Tympanic membrane, ear canal and external ear normal.     Left Ear: Tympanic membrane, ear canal and external ear normal.     Nose: Nose normal.     Mouth/Throat:     Mouth: Mucous membranes are moist.     Pharynx: Oropharynx is clear.  Eyes:     Extraocular Movements: Extraocular movements intact.     Conjunctiva/sclera: Conjunctivae normal.     Pupils: Pupils are equal, round, and reactive to light.  Cardiovascular:     Rate and Rhythm: Normal rate and regular rhythm.     Pulses: Normal pulses.     Heart sounds: Normal heart sounds.  Pulmonary:  Effort: Pulmonary effort is normal.     Breath sounds: Normal breath sounds.  Abdominal:     General: Abdomen is flat. Bowel sounds are normal.     Palpations: Abdomen is soft.     Tenderness: There is no abdominal tenderness.  Musculoskeletal:        General: Normal range of motion.     Cervical back: Normal range of motion and neck supple.  Skin:    General: Skin is warm and dry.     Findings: Lesion (skin tag left side of neck) present.  Neurological:     General: No focal deficit present.     Mental Status: He is alert and oriented to person, place, and time.  Psychiatric:        Mood and Affect: Mood normal.        Behavior: Behavior normal.             Alejandro Dechert M Shamiracle Gorden, PA-C

## 2024-01-29 ENCOUNTER — Ambulatory Visit: Admitting: Physician Assistant

## 2024-01-30 ENCOUNTER — Ambulatory Visit: Payer: Self-pay | Admitting: Physician Assistant

## 2024-02-01 LAB — LIPOPROTEIN A (LPA): Lipoprotein (a): <10 nmol/L (ref <75)

## 2024-02-03 NOTE — Telephone Encounter (Signed)
 Please see pt response as Lorain Childes

## 2024-02-15 ENCOUNTER — Other Ambulatory Visit: Payer: Self-pay | Admitting: Physician Assistant

## 2024-02-15 DIAGNOSIS — I10 Essential (primary) hypertension: Secondary | ICD-10-CM

## 2024-03-01 ENCOUNTER — Other Ambulatory Visit (HOSPITAL_COMMUNITY): Payer: Self-pay

## 2024-03-01 ENCOUNTER — Encounter: Payer: Self-pay | Admitting: Cardiovascular Disease

## 2024-03-01 ENCOUNTER — Ambulatory Visit: Attending: Cardiovascular Disease | Admitting: Cardiovascular Disease

## 2024-03-01 VITALS — BP 120/88 | HR 67 | Ht 68.0 in | Wt 193.2 lb

## 2024-03-01 DIAGNOSIS — E782 Mixed hyperlipidemia: Secondary | ICD-10-CM

## 2024-03-01 DIAGNOSIS — Z8249 Family history of ischemic heart disease and other diseases of the circulatory system: Secondary | ICD-10-CM

## 2024-03-01 DIAGNOSIS — R0683 Snoring: Secondary | ICD-10-CM | POA: Diagnosis not present

## 2024-03-01 DIAGNOSIS — R0789 Other chest pain: Secondary | ICD-10-CM | POA: Diagnosis not present

## 2024-03-01 DIAGNOSIS — I10 Essential (primary) hypertension: Secondary | ICD-10-CM | POA: Diagnosis not present

## 2024-03-01 DIAGNOSIS — R072 Precordial pain: Secondary | ICD-10-CM

## 2024-03-01 DIAGNOSIS — E785 Hyperlipidemia, unspecified: Secondary | ICD-10-CM | POA: Insufficient documentation

## 2024-03-01 MED ORDER — ATORVASTATIN CALCIUM 40 MG PO TABS
40.0000 mg | ORAL_TABLET | Freq: Every day | ORAL | 3 refills | Status: DC
Start: 2024-03-01 — End: 2024-06-01

## 2024-03-01 MED ORDER — METOPROLOL TARTRATE 50 MG PO TABS
50.0000 mg | ORAL_TABLET | Freq: Once | ORAL | 0 refills | Status: AC
  Filled 2024-03-01: qty 1, 1d supply, fill #0

## 2024-03-01 NOTE — Patient Instructions (Addendum)
 Medication Instructions:  Your physician has recommended you make the following change in your medication:   -Start atorvastatin  (lipitor) 40mg  once daily.  *If you need a refill on your cardiac medications before your next appointment, please call your pharmacy*  Lab Work: Your physician recommends that you return for lab work in: 3 months for FASTING lipid/liver panel  If you have labs (blood work) drawn today and your tests are completely normal, you will receive your results only by: MyChart Message (if you have MyChart) OR A paper copy in the mail If you have any lab test that is abnormal or we need to change your treatment, we will call you to review the results.  Testing/Procedures: Your physician has requested that you have an echocardiogram. Echocardiography is a painless test that uses sound waves to create images of your heart. It provides your doctor with information about the size and shape of your heart and how well your heart's chambers and valves are working. This procedure takes approximately one hour. There are no restrictions for this procedure. Please do NOT wear cologne, perfume, aftershave, or lotions (deodorant is allowed). Please arrive 15 minutes prior to your appointment time.  Please note: We ask at that you not bring children with you during ultrasound (echo/ vascular) testing. Due to room size and safety concerns, children are not allowed in the ultrasound rooms during exams. Our front office staff cannot provide observation of children in our lobby area while testing is being conducted. An adult accompanying a patient to their appointment will only be allowed in the ultrasound room at the discretion of the ultrasound technician under special circumstances. We apologize for any inconvenience.   Follow-Up: At [facility], you and your health needs are our priority.  As part of our continuing mission to provide you with exceptional heart care, our  providers are all part of one team.  This team includes your primary Cardiologist (physician) and Advanced Practice Providers or APPs (Physician Assistants and Nurse Practitioners) who all work together to provide you with the care you need, when you need it.  Your next appointment:   3 month(s)  Provider:   Dorn Lesches, MD    We recommend signing up for the patient portal called MyChart.  Sign up information is provided on this After Visit Summary.  MyChart is used to connect with patients for Virtual Visits (Telemedicine).  Patients are able to view lab/test results, encounter notes, upcoming appointments, etc.  Non-urgent messages can be sent to your provider as well.   To learn more about what you can do with MyChart, go to [email or web address].   Other Instructions   Your cardiac CT will be scheduled at the below location:    [name] [address]  [place], [place] [address] [phone]   If scheduled at the Heart and Vascular Tower at [facility] street, please enter the parking lot using the [address] entrance and use the FREE valet service at the patient drop-off area. Enter the building and check-in with registration on the main floor.   Please follow these instructions carefully (unless otherwise directed):  An IV will be required for this test and Nitroglycerin will be given.  Hold all erectile dysfunction medications at least 3 days (72 hrs) prior to test. (Ie viagra, cialis, sildenafil, tadalafil, etc)   On the Night Before the Test: Be sure to Drink plenty of water. Do not consume any caffeinated/decaffeinated beverages or chocolate 12 hours  prior to your test. Do not take any antihistamines 12 hours prior to your test.  On the Day of the Test: Drink plenty of water until 1 hour prior to the test. Do not eat any food 1 hour prior to test. You may take your regular medications prior to the test.  Take metoprolol   (Lopressor ) 50mg  two hours prior to test. Patients who wear a continuous glucose monitor MUST remove the device prior to scanning. FEMALES- please wear underwire-free bra if available, avoid dresses & tight clothing       After the Test: Drink plenty of water. After receiving IV contrast, you may experience a mild flushed feeling. This is normal. On occasion, you may experience a mild rash up to 24 hours after the test. This is not dangerous. If this occurs, you can take Benadryl 25 mg, Zyrtec, Claritin, or Allegra and increase your fluid intake. (Patients taking Tikosyn should avoid Benadryl, and may take Zyrtec, Claritin, or Allegra) If you experience trouble breathing, this can be serious. If it is severe call 911 IMMEDIATELY. If it is mild, please call our office.  We will call to schedule your test 2-4 weeks out understanding that some insurance companies will need an authorization prior to the service being performed.   For more information and frequently asked questions, please visit our website : [email or web address]  For non-scheduling related questions, please contact the cardiac imaging nurse navigator should you have any questions/concerns: Cardiac Imaging Nurse Navigators Direct Office Dial: [phone]   For scheduling needs, including cancellations and rescheduling, please call [place], [phone].   Heart-Healthy Eating Plan Eating a healthy diet is important for the health of your heart. A heart-healthy eating plan includes: Eating less unhealthy fats. Eating more healthy fats. Eating less salt in your food. Salt is also called sodium. Making other changes in your diet. Cooking Avoid frying your food. Try to bake, boil, grill, or broil it instead. You can also reduce fat by: Removing the skin from poultry. Removing all visible fats from meats. Steaming vegetables in water or broth. Meal planning  At meals, divide your plate into four equal  parts: Fill one-half of your plate with vegetables and green salads. Fill one-fourth of your plate with whole grains. Fill one-fourth of your plate with lean protein foods. Eat 2-4 cups of vegetables per day. One cup of vegetables is: 1 cup (91 g) broccoli or cauliflower florets. 2 medium carrots. 1 large bell pepper. 1 large sweet potato. 1 large tomato. 1 medium white potato. 2 cups (150 g) raw leafy greens. Eat 1-2 cups of fruit per day. One cup of fruit is: 1 small apple 1 large banana 1 cup (237 g) mixed fruit, 1 large orange,  cup (82 g) dried fruit, 1 cup (240 mL) 100% fruit juice. Eat more foods that have soluble fiber. These are apples, broccoli, carrots, beans, peas, and barley. Try to get 20-30 g of fiber per day. Eat 4-5 servings of nuts, legumes, and seeds per week: 1 serving of dried beans or legumes equals  cup (90 g) cooked. 1 serving of nuts is  oz (12 almonds, 24 pistachios, or 7 walnut halves). 1 serving of seeds equals  oz (8 g). General information Eat more home-cooked food. Eat less restaurant, buffet, and fast food. Limit or avoid alcohol. Limit foods that are high in starch and sugar. Avoid fried foods. Lose weight if you are overweight. Keep track of how much salt (sodium) you eat. This is important if you  have high blood pressure. Ask your doctor to tell you more about this. Try to add vegetarian meals each week. Fats Choose healthy fats. These include olive oil and canola oil, flaxseeds, walnuts, almonds, and seeds. Eat more omega-3 fats. These include salmon, mackerel, sardines, tuna, flaxseed oil, and ground flaxseeds. Try to eat fish at least 2 times each week. Check food labels. Avoid foods with trans fats or high amounts of saturated fat. Limit saturated fats. These are often found in animal products, such as meats, butter, and cream. These are also found in plant foods, such as palm oil, palm kernel oil, and coconut oil. Avoid foods with  partially hydrogenated oils in them. These have trans fats. Examples are stick margarine, some tub margarines, cookies, crackers, and other baked goods. What foods should I eat? Fruits All fresh, canned (in natural juice), or frozen fruits. Vegetables Fresh or frozen vegetables (raw, steamed, roasted, or grilled). Green salads. Grains Most grains. Choose whole wheat and whole grains most of the time. Rice and pasta, including brown rice and pastas made with whole wheat. Meats and other proteins Lean, well-trimmed beef, veal, pork, and lamb. Chicken and [place] without skin. All fish and shellfish. Wild duck, rabbit, pheasant, and venison. Egg whites or low-cholesterol egg substitutes. Dried beans, peas, lentils, and tofu. Seeds and most nuts. Dairy Low-fat or nonfat cheeses, including ricotta and mozzarella. Skim or 1% milk that is liquid, powdered, or evaporated. Buttermilk that is made with low-fat milk. Nonfat or low-fat yogurt. Fats and oils Non-hydrogenated (trans-free) margarines. Vegetable oils, including soybean, sesame, sunflower, olive, peanut, safflower, corn, canola, and cottonseed. Salad dressings or mayonnaise made with a vegetable oil. Beverages Mineral water. Coffee and tea. Diet carbonated beverages. Sweets and desserts Sherbet, gelatin, and fruit ice. Small amounts of dark chocolate. Limit all sweets and desserts. Seasonings and condiments All seasonings and condiments. The items listed above may not be a complete list of foods and drinks you can eat. Contact a dietitian for more options. What foods should I avoid? Fruits Canned fruit in heavy syrup. Fruit in cream or butter sauce. Fried fruit. Limit coconut. Vegetables Vegetables cooked in cheese, cream, or butter sauce. Fried vegetables. Grains Breads that are made with saturated or trans fats, oils, or whole milk. Croissants. Sweet rolls. Donuts. High-fat crackers, such as cheese crackers. Meats and other  proteins Fatty meats, such as hot dogs, ribs, sausage, bacon, rib-eye roast or steak. High-fat deli meats, such as salami and bologna. Caviar. Domestic duck and goose. Organ meats, such as liver. Dairy Cream, sour cream, cream cheese, and creamed cottage cheese. Whole-milk cheeses. Whole or 2% milk that is liquid, evaporated, or condensed. Whole buttermilk. Cream sauce or high-fat cheese sauce. Yogurt that is made from whole milk. Fats and oils Meat fat, or shortening. Cocoa butter, hydrogenated oils, palm oil, coconut oil, palm kernel oil. Solid fats and shortenings, including bacon fat, salt pork, lard, and butter. Nondairy cream substitutes. Salad dressings with cheese or sour cream. Beverages Regular sodas and juice drinks with added sugar. Sweets and desserts Frosting. Pudding. Cookies. Cakes. Pies. Milk chocolate or white chocolate. Buttered syrups. Full-fat ice cream or ice cream drinks. The items listed above may not be a complete list of foods and drinks to avoid. Contact a dietitian for more information. Summary Heart-healthy meal planning includes eating less unhealthy fats, eating more healthy fats, and making other changes in your diet. Eat a balanced diet. This includes fruits and vegetables, low-fat or nonfat dairy, lean protein,  nuts and legumes, whole grains, and heart-healthy oils and fats. This information is not intended to replace advice given to you by your health care provider. Make sure you discuss any questions you have with your health care provider. Document Revised: 07/29/2021 Document Reviewed: 07/29/2021 Elsevier Patient Education  2024 [employer].

## 2024-03-01 NOTE — Progress Notes (Signed)
 03/01/2024 Alejandro Weber   01-Aug-1985  [id number]  Primary Physician Allwardt, Mardy HERO, PA-C Primary Cardiologist: Dorn JINNY Lesches MD [place] [place], [place]  HPI:  Alejandro Weber is a 38 y.o. mildly overweight married Caucasian male father of 3 children who is accompanied by his wife Alejandro Weber today.  He was referred by his primary care provider, Alyssa Allwardt PA-C for risk factors and symptoms of CAD.  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  His cardiac risk factors are notable for family history of the father who had PCI at age 39.  He has treated hypertension and untreated hyperlipidemia.  He is fairly sedentary and does not exercise.  He is never had a heart attack or stroke.  Does have mild obstructive sleep apnea intolerant to CPAP.  Over the last year has had 4-6 episodes of substernal chest pain/pressure without radiation lasting for minutes at a time.  He also has some had some episodes of dyspnea on exertion.   Current Meds  Medication Sig   buPROPion  (WELLBUTRIN  XL) 300 MG 24 hr tablet Take 1 tablet (300 mg total) by mouth daily.   escitalopram  (LEXAPRO ) 10 MG tablet Take 1 tablet (10 mg total) by mouth daily.   Multiple Vitamins-Minerals (MENS MULTIVITAMIN PLUS PO) Take by mouth daily.   olmesartan -hydrochlorothiazide (BENICAR  HCT) 20-12.5 MG tablet Take 1 tablet by mouth daily.     Allergies  Allergen Reactions   Amoxicillin Hives    Social History   Socioeconomic History   Marital status: Married    Spouse name: Not on file   Number of children: 3   Years of education: Not on file   Highest education level: Master's degree (e.g., MA, MS, MEng, MEd, MSW, MBA)  Occupational History   Occupation: IT trainer  Tobacco Use   Smoking status: Never   Smokeless tobacco: Never  Vaping Use   Vaping status: Never Used  Substance and Sexual Activity   Alcohol use: No   Drug use: No   Sexual activity: Yes    Partners:  Female  Other Topics Concern   Not on file  Social History Narrative   Not on file   Social Drivers of Health   Financial Resource Strain: Low Risk  (01/25/2024)   Overall Financial Resource Strain (CARDIA)    Difficulty of Paying Living Expenses: Not hard at all  Food Insecurity: No Food Insecurity (01/25/2024)   Hunger Vital Sign    Worried About Running Out of Food in the Last Year: Never true    Ran Out of Food in the Last Year: Never true  Transportation Needs: No Transportation Needs (01/25/2024)   PRAPARE - Administrator, Civil Service (Medical): No    Lack of Transportation (Non-Medical): No  Physical Activity: Insufficiently Active (01/25/2024)   Exercise Vital Sign    Days of Exercise per Week: 2 days    Minutes of Exercise per Session: 20 min  Stress: Stress Concern Present (01/25/2024)   [employer] of Occupational Health - Occupational Stress Questionnaire    Feeling of Stress: To some extent  Social Connections: Socially Integrated (01/25/2024)   Social Connection and Isolation Panel    Frequency of Communication with Friends and Family: More than three times a week    Frequency of Social Gatherings with Friends and Family: More than three times a week    Attends Religious Services: More than 4 times  per year    Active Member of Clubs or Organizations: Yes    Attends Banker Meetings: More than 4 times per year    Marital Status: Married  Catering manager Violence: Not on file     Review of Systems: General: negative for chills, fever, night sweats or weight changes.  Cardiovascular: negative for chest pain, dyspnea on exertion, edema, orthopnea, palpitations, paroxysmal nocturnal dyspnea or shortness of breath Dermatological: negative for rash Respiratory: negative for cough or wheezing Urologic: negative for hematuria Abdominal: negative for nausea, vomiting, diarrhea, bright red blood per rectum, melena, or hematemesis Neurologic:  negative for visual changes, syncope, or dizziness All other systems reviewed and are otherwise negative except as noted above.    Blood pressure 120/88, pulse 67, height 5' 8 (1.727 m), weight 193 lb 3.2 oz (87.6 kg), SpO2 98%.  General appearance: alert and no distress Neck: no adenopathy, no carotid bruit, no JVD, supple, symmetrical, trachea midline, and thyroid  not enlarged, symmetric, no tenderness/mass/nodules Lungs: clear to auscultation bilaterally Heart: regular rate and rhythm, S1, S2 normal, no murmur, click, rub or gallop Extremities: extremities normal, atraumatic, no cyanosis or edema Pulses: 2+ and symmetric Skin: Skin color, texture, turgor normal. No rashes or lesions Neurologic: Grossly normal  EKG EKG Interpretation Date/Time:  Tuesday March 01 2024 14:43:33 EDT Ventricular Rate:  71 PR Interval:  146 QRS Duration:  96 QT Interval:  380 QTC Calculation: 412 R Axis:   69  Text Interpretation: Normal sinus rhythm Normal ECG When compared with ECG of 27-May-2022 08:01, PREVIOUS ECG IS PRESENT Confirmed by Court Carrier [phone]) on 03/01/2024 2:44:45 PM    ASSESSMENT AND PLAN:   Family history of early CAD Father had PCI in his 59s.  Snoring Intolerant to CPAP  Essential hypertension Blood pressure today is 120/88.  He is on Benicar /hydrochlorothiazide.  Hyperlipidemia History of hyperlipidemia not on statin therapy with lipid profile performed 01/24/2024 revealing total cholesterol 256, LDL 171 and HDL 55.  I am going to begin him on atorvastatin  40 mg a day and we will recheck a lipid liver profile in 3 months.  Atypical chest pain Patient has had 4-6 episodes of chest pain over the last 12 months.  Is characterized as substernal, lasts for minutes at a time without radiation and without instigating causes.  He has had some dyspnea on exertion.  Given his risk factor profile I am going to get a 2D echo and a coronary CTA to further  evaluate.     Carrier DOROTHA Court MD FACP,FACC,FAHA, [facility] 03/01/2024 3:04 PM

## 2024-03-01 NOTE — Assessment & Plan Note (Signed)
 Blood pressure today is 120/88.  He is on Benicar /hydrochlorothiazide.

## 2024-03-01 NOTE — Assessment & Plan Note (Signed)
 History of hyperlipidemia not on statin therapy with lipid profile performed 01/24/2024 revealing total cholesterol 256, LDL 171 and HDL 55.  I am going to begin him on atorvastatin  40 mg a day and we will recheck a lipid liver profile in 3 months.

## 2024-03-01 NOTE — Assessment & Plan Note (Signed)
 Patient has had 4-6 episodes of chest pain over the last 12 months.  Is characterized as substernal, lasts for minutes at a time without radiation and without instigating causes.  He has had some dyspnea on exertion.  Given his risk factor profile I am going to get a 2D echo and a coronary CTA to further evaluate.

## 2024-03-01 NOTE — Assessment & Plan Note (Signed)
 Father had PCI in his 66s.

## 2024-03-01 NOTE — Assessment & Plan Note (Signed)
Intolerant to CPAP 

## 2024-03-03 ENCOUNTER — Encounter (HOSPITAL_COMMUNITY): Payer: Self-pay

## 2024-03-08 ENCOUNTER — Ambulatory Visit: Payer: Self-pay | Admitting: Cardiovascular Disease

## 2024-03-08 ENCOUNTER — Ambulatory Visit (HOSPITAL_COMMUNITY)
Admission: RE | Admit: 2024-03-08 | Discharge: 2024-03-08 | Disposition: A | Discharge status: Home / Self Care | Source: Ambulatory Visit | Attending: Cardiovascular Disease | Admitting: Cardiovascular Disease

## 2024-03-08 DIAGNOSIS — E782 Mixed hyperlipidemia: Secondary | ICD-10-CM

## 2024-03-08 DIAGNOSIS — R072 Precordial pain: Secondary | ICD-10-CM

## 2024-03-08 MED ORDER — NITROGLYCERIN 0.4 MG SL SUBL
0.8000 mg | SUBLINGUAL_TABLET | Freq: Once | SUBLINGUAL | Status: AC
  Administered 2024-03-08: 0.8 mg via SUBLINGUAL

## 2024-03-08 MED ORDER — IOHEXOL 350 MG/ML SOLN
100.0000 mL | Freq: Once | INTRAVENOUS | Status: AC | PRN
  Administered 2024-03-08: 100 mL via INTRAVENOUS

## 2024-04-04 ENCOUNTER — Ambulatory Visit (HOSPITAL_COMMUNITY)
Admission: RE | Admit: 2024-04-04 | Discharge: 2024-04-04 | Disposition: A | Discharge status: Home / Self Care | Source: Ambulatory Visit | Attending: Cardiology | Admitting: Cardiology

## 2024-04-04 DIAGNOSIS — R072 Precordial pain: Secondary | ICD-10-CM | POA: Insufficient documentation

## 2024-04-04 DIAGNOSIS — I10 Essential (primary) hypertension: Secondary | ICD-10-CM | POA: Diagnosis present

## 2024-04-04 DIAGNOSIS — E785 Hyperlipidemia, unspecified: Secondary | ICD-10-CM | POA: Diagnosis not present

## 2024-04-04 DIAGNOSIS — Z8249 Family history of ischemic heart disease and other diseases of the circulatory system: Secondary | ICD-10-CM | POA: Diagnosis present

## 2024-04-04 DIAGNOSIS — R0789 Other chest pain: Secondary | ICD-10-CM | POA: Insufficient documentation

## 2024-04-04 DIAGNOSIS — G4733 Obstructive sleep apnea (adult) (pediatric): Secondary | ICD-10-CM | POA: Diagnosis not present

## 2024-04-04 LAB — ECHOCARDIOGRAM COMPLETE
Area-P 1/2: 4.13 cm2
S' Lateral: 2.9 cm

## 2024-05-30 LAB — LIPID PANEL
Chol/HDL Ratio: 3.2 ratio (ref 0.0–5.0)
Cholesterol, Total: 191 mg/dL (ref 100–199)
HDL: 59 mg/dL (ref >39)
LDL Chol Calc (NIH): 108 mg/dL — ABNORMAL HIGH (ref 0–99)
Triglycerides: 139 mg/dL (ref 0–149)
VLDL Cholesterol Cal: 24 mg/dL (ref 5–40)

## 2024-05-30 LAB — HEPATIC FUNCTION PANEL
ALT: 58 IU/L — ABNORMAL HIGH (ref 0–44)
AST: 42 IU/L — ABNORMAL HIGH (ref 0–40)
Albumin: 4.7 g/dL (ref 4.1–5.1)
Alkaline Phosphatase: 76 IU/L (ref 47–123)
Bilirubin Total: 0.3 mg/dL (ref 0.0–1.2)
Bilirubin, Direct: 0.11 mg/dL (ref 0.00–0.40)
Total Protein: 7.2 g/dL (ref 6.0–8.5)

## 2024-06-01 NOTE — Addendum Note (Signed)
 Addended by: RICHIE ADRIEN ORN on: 06/01/2024 10:05 AM   Modules accepted: Orders

## 2024-06-07 ENCOUNTER — Ambulatory Visit: Attending: Cardiovascular Disease | Admitting: Cardiovascular Disease

## 2024-06-07 ENCOUNTER — Encounter: Payer: Self-pay | Admitting: Cardiovascular Disease

## 2024-06-07 VITALS — BP 116/78 | HR 75 | Ht 68.0 in | Wt 198.8 lb

## 2024-06-07 DIAGNOSIS — E782 Mixed hyperlipidemia: Secondary | ICD-10-CM | POA: Diagnosis not present

## 2024-06-07 NOTE — Progress Notes (Signed)
 Mr. Alejandro Weber returns today for follow-up of his outpatient test ordered and evaluation of chest pain and shortness of breath.  These were done in the setting of family history of heart disease and treated hypertension and hyperlipidemia.  He did have a coronary CTA performed 03/08/2024 which was entirely normal with a coronary calcium  score of 0.  A 2D echo performed 04/04/2024 was normal as well.  He said no recurrent symptoms since I saw him 3 months ago.  He now in retrospect attributes his symptoms to stress.  We talked about the importance of active lifestyle and healthy lifestyle.  I am going to supply him with a copy of a heart healthy diet.  His LDL did come down from 171-108 on atorvastatin  although he did have elevated liver function tests.  I am referring him to a Pharm.D. to discuss initiation of PCSK9 for LDL less than 100 (primary prevention).  His blood pressure is under good control.  I will see him back in 6 months for follow-up.  Dorn DOROTHA Lesches, M.D., FACP, [facility], FAHA, [facility]  [address] [place], [place]  [address]  [phone] 06/07/2024 8:18 AM

## 2024-06-07 NOTE — Patient Instructions (Signed)
 Medication Instructions:  Your physician recommends that you continue on your current medications as directed. Please refer to the Current Medication list given to you today.  *If you need a refill on your cardiac medications before your next appointment, please call your pharmacy*   Follow-Up: At [facility], you and your health needs are our priority.  As part of our continuing mission to provide you with exceptional heart care, our providers are all part of one team.  This team includes your primary Cardiologist (physician) and Advanced Practice Providers or APPs (Physician Assistants and Nurse Practitioners) who all work together to provide you with the care you need, when you need it.  Your next appointment:   6 month(s)  Provider:   Dorn Lesches, MD    Other Instructions We will schedule you an appointment to see a clinical PharmD to discuss cholesterol therapy.  Heart-Healthy Eating Plan Eating a healthy diet is important for the health of your heart. A heart-healthy eating plan includes: Eating less unhealthy fats. Eating more healthy fats. Eating less salt in your food. Salt is also called sodium. Making other changes in your diet. Talk with your doctor or a diet specialist (dietitian) to create an eating plan that is right for you. Cooking Avoid frying your food. Try to bake, boil, grill, or broil it instead. You can also reduce fat by: Removing the skin from poultry. Removing all visible fats from meats. Steaming vegetables in water or broth. Meal planning  At meals, divide your plate into four equal parts: Fill one-half of your plate with vegetables and green salads. Fill one-fourth of your plate with whole grains. Fill one-fourth of your plate with lean protein foods. Eat 2-4 cups of vegetables per day. One cup of vegetables is: 1 cup (91 g) broccoli or cauliflower florets. 2 medium carrots. 1 large bell pepper. 1 large sweet potato. 1 large  tomato. 1 medium white potato. 2 cups (150 g) raw leafy greens. Eat 1-2 cups of fruit per day. One cup of fruit is: 1 small apple 1 large banana 1 cup (237 g) mixed fruit, 1 large orange,  cup (82 g) dried fruit, 1 cup (240 mL) 100% fruit juice. Eat more foods that have soluble fiber. These are apples, broccoli, carrots, beans, peas, and barley. Try to get 20-30 g of fiber per day. Eat 4-5 servings of nuts, legumes, and seeds per week: 1 serving of dried beans or legumes equals  cup (90 g) cooked. 1 serving of nuts is  oz (12 almonds, 24 pistachios, or 7 walnut halves). 1 serving of seeds equals  oz (8 g). General information Eat more home-cooked food. Eat less restaurant, buffet, and fast food. Limit or avoid alcohol. Limit foods that are high in starch and sugar. Avoid fried foods. Lose weight if you are overweight. Keep track of how much salt (sodium) you eat. This is important if you have high blood pressure. Ask your doctor to tell you more about this. Try to add vegetarian meals each week. Fats Choose healthy fats. These include olive oil and canola oil, flaxseeds, walnuts, almonds, and seeds. Eat more omega-3 fats. These include salmon, mackerel, sardines, tuna, flaxseed oil, and ground flaxseeds. Try to eat fish at least 2 times each week. Check food labels. Avoid foods with trans fats or high amounts of saturated fat. Limit saturated fats. These are often found in animal products, such as meats, butter, and cream. These are also found in plant foods, such as palm  oil, palm kernel oil, and coconut oil. Avoid foods with partially hydrogenated oils in them. These have trans fats. Examples are stick margarine, some tub margarines, cookies, crackers, and other baked goods. What foods should I eat? Fruits All fresh, canned (in natural juice), or frozen fruits. Vegetables Fresh or frozen vegetables (raw, steamed, roasted, or grilled). Green salads. Grains Most grains.  Choose whole wheat and whole grains most of the time. Rice and pasta, including brown rice and pastas made with whole wheat. Meats and other proteins Lean, well-trimmed beef, veal, pork, and lamb. Chicken and turkey without skin. All fish and shellfish. Wild duck, rabbit, pheasant, and venison. Egg whites or low-cholesterol egg substitutes. Dried beans, peas, lentils, and tofu. Seeds and most nuts. Dairy Low-fat or nonfat cheeses, including ricotta and mozzarella. Skim or 1% milk that is liquid, powdered, or evaporated. Buttermilk that is made with low-fat milk. Nonfat or low-fat yogurt. Fats and oils Non-hydrogenated (trans-free) margarines. Vegetable oils, including soybean, sesame, sunflower, olive, peanut, safflower, corn, canola, and cottonseed. Salad dressings or mayonnaise made with a vegetable oil. Beverages Mineral water. Coffee and tea. Diet carbonated beverages. Sweets and desserts Sherbet, gelatin, and fruit ice. Small amounts of dark chocolate. Limit all sweets and desserts. Seasonings and condiments All seasonings and condiments. The items listed above may not be a complete list of foods and drinks you can eat. Contact a dietitian for more options. What foods should I avoid? Fruits Canned fruit in heavy syrup. Fruit in cream or butter sauce. Fried fruit. Limit coconut. Vegetables Vegetables cooked in cheese, cream, or butter sauce. Fried vegetables. Grains Breads that are made with saturated or trans fats, oils, or whole milk. Croissants. Sweet rolls. Donuts. High-fat crackers, such as cheese crackers. Meats and other proteins Fatty meats, such as hot dogs, ribs, sausage, bacon, rib-eye roast or steak. High-fat deli meats, such as salami and bologna. Caviar. Domestic duck and goose. Organ meats, such as liver. Dairy Cream, sour cream, cream cheese, and creamed cottage cheese. Whole-milk cheeses. Whole or 2% milk that is liquid, evaporated, or condensed. Whole buttermilk. Cream  sauce or high-fat cheese sauce. Yogurt that is made from whole milk. Fats and oils Meat fat, or shortening. Cocoa butter, hydrogenated oils, palm oil, coconut oil, palm kernel oil. Solid fats and shortenings, including bacon fat, salt pork, lard, and butter. Nondairy cream substitutes. Salad dressings with cheese or sour cream. Beverages Regular sodas and juice drinks with added sugar. Sweets and desserts Frosting. Pudding. Cookies. Cakes. Pies. Milk chocolate or white chocolate. Buttered syrups. Full-fat ice cream or ice cream drinks. The items listed above may not be a complete list of foods and drinks to avoid. Contact a dietitian for more information. Summary Heart-healthy meal planning includes eating less unhealthy fats, eating more healthy fats, and making other changes in your diet. Eat a balanced diet. This includes fruits and vegetables, low-fat or nonfat dairy, lean protein, nuts and legumes, whole grains, and heart-healthy oils and fats. This information is not intended to replace advice given to you by your health care provider. Make sure you discuss any questions you have with your health care provider. Document Revised: 07/29/2021 Document Reviewed: 07/29/2021 Elsevier Patient Education  2024 [employer].

## 2024-06-29 ENCOUNTER — Telehealth: Payer: Self-pay | Admitting: Pharmacy Technician

## 2024-06-29 ENCOUNTER — Telehealth: Payer: Self-pay

## 2024-06-29 ENCOUNTER — Ambulatory Visit: Attending: Cardiology

## 2024-06-29 ENCOUNTER — Other Ambulatory Visit (HOSPITAL_COMMUNITY): Payer: Self-pay

## 2024-06-29 DIAGNOSIS — E782 Mixed hyperlipidemia: Secondary | ICD-10-CM

## 2024-06-29 NOTE — Progress Notes (Signed)
 Patient ID: Alejandro Weber                 DOB: 1986-06-06                    MRN: [id number]      HPI: Alejandro Weber is a 38 y.o. male patient referred to lipid clinic by Dr. Court. PMH is significant for HTN, HLD, mild OSA, and anxiety.  Patient was last evaluated by Dr. Court a few weeks ago for follow up of chest pain and SOB. Coronary CTA (03/2024) was normal and coronary calcium  score of 0. 2D echo (03/2024) normal as well. Patient now in retrospect attributes his symptoms to stress. He does have a family history of heart disease and treated HTN and HLD. Most recent LDL is 108 mg/dL which is improved from 171 mg/dL while on atorvastatin  40 mg although he did have elevated LFTs and therefore, statin was discontinued. Patient referred to PharmD to discuss initiation of PCSK9i for LDL < 100 (primary prevention).   Patient presents today in good spirits. He is currently not taking any lipid lowering medication. He reports stopping statin a few weeks ago after provider noted elevated LFTs. He reports no adverse effects on the medication. Most recent lipid panel is reflective of atorvastatin  therapy. He notes that he eats a lot of processed and frozen foods, whatever is convenient. Does not have a structured exercised regimen but willing to start walking and eating healthier.  Reviewed options for lowering LDL cholesterol, including, PCSK-9 inhibitors, bempedoic acid, and inclisiran. Discussed mechanisms of action, dosing, side effects and potential decreases in LDL cholesterol.  Also reviewed cost information and potential options for patient assistance.   Current Medications: none  Intolerances: atorvastatin  (elevated liver enzymes) Risk Factors: family hx of premature CAD  LDL goal: < 100 Lipid panel (05/2024): Chol 191, Trig 139, HDL 59, LDL 108 (while on statin therapy) Lpa (01/2024): < 10 Liver enzymes (05/2024): AST 42, ALT 58, Alk phos 76  Diet: Diet consists mainly of frozen meals, chicken  nuggets, processed foods; beverages include coffee, limited water and some lemonade.  Exercise:  No structured exercise regimen  Family History:  Mother - Alejandro Weber (Alive) Hyperlipidemia Father,  MGM    Father - Alejandro Weber (Alive) Esophageal cancer   Heart disease (Age: 22) cad-angioplasty  Hypertension MGM    Maternal Grandfather Coronary artery disease   Prostate cancer     Paternal Grandmother - Alejandro Weber Diabetes     Paternal Grandfather Colon cancer (Age: 41)     Son - Alejandro Weber (Alive) ADD / ADHD      Social History:  Alcohol: socially Smoking: none   Labs:  Lipid Panel     Component Value Date/Time   CHOL 191 05/30/2024 0837   TRIG 139 05/30/2024 0837   HDL 59 05/30/2024 0837   CHOLHDL 3.2 05/30/2024 0837   CHOLHDL 5 01/28/2024 0835   VLDL 29.8 01/28/2024 0835   LDLCALC 108 (H) 05/30/2024 0837   LABVLDL 24 05/30/2024 0837    Past Medical History:  Diagnosis Date   Anxiety    Chronic kidney disease    History of chicken pox    childhood   History of kidney stones 07/07/2004   Hyperlipidemia 03/01/2024   Hypertension    Renal disorder     Medications Ordered Prior to Encounter[1]  Allergies[2]  Assessment/Plan:  1. Hyperlipidemia -  Problem  Hyperlipidemia   Hyperlipidemia    Hyperlipidemia Assessment:  LDL goal: < 100  mg/dl; last LDLc 891 mg/dl (88/7974) while on atorvastatin   Statin discontinued a few weeks ago due to mildly elevated liver enzymes (05/2024): AST 42, ALT 58, Alk Phos 76. Patient previously tolerated atorvastatin  well without any side effects  Discussed next potential options (PCSK-9 inhibitors, bempedoic acid and inclisiran); cost, dosing efficacy, side effects Patient is willing to proceed with PCSK9 inhibitor therapy Encouraged a heart-healthy diet, limiting processed foods, and preparing meals with baked protein and non-starchy vegetables; Additionally, more water intake Recommended regular physical activity: at  least 150 minutes of moderate-intensity exercise per week  Plan: Will apply for PA for PCSK9i; will inform patient upon approval  Lipid lab due in 3 months after starting PCSK9i    Thank you,  Jemmie Rhinehart E. Giovany Cosby, Pharm.D, CPP [facility] [name]. [facility] [address], [place], [place] [address] Phone: [phone]; Fax: [phone]        [1]  Current Outpatient Medications on File Prior to Visit  Medication Sig Dispense Refill   buPROPion  (WELLBUTRIN  XL) 300 MG 24 hr tablet Take 1 tablet (300 mg total) by mouth daily. 90 tablet 3   escitalopram  (LEXAPRO ) 10 MG tablet Take 1 tablet (10 mg total) by mouth daily. 90 tablet 3   metoprolol  tartrate (LOPRESSOR ) 50 MG tablet Take 1 tablet (50 mg total) by mouth TWO hours prior to CT scan. (Patient not taking: Reported on 06/07/2024) 1 tablet 0   Multiple Vitamins-Minerals (MENS MULTIVITAMIN PLUS PO) Take by mouth daily.     olmesartan -hydrochlorothiazide (BENICAR  HCT) 20-12.5 MG tablet Take 1 tablet by mouth daily. 90 tablet 3   No current facility-administered medications on file prior to visit.  [2]  Allergies Allergen Reactions   Amoxicillin Hives

## 2024-06-29 NOTE — Assessment & Plan Note (Addendum)
 Assessment:  LDL goal: < 100  mg/dl; last LDLc 891 mg/dl (88/7974) while on atorvastatin   Statin discontinued a few weeks ago due to mildly elevated liver enzymes (05/2024): AST 42, ALT 58, Alk Phos 76 Patient previously tolerated atorvastatin  well without any side effects  Discussed next potential options (PCSK-9 inhibitors, bempedoic acid and inclisiran); cost, dosing efficacy, side effects Patient is willing to proceed with PCSK9 inhibitor therapy Encouraged a heart-healthy diet, limiting processed foods, and preparing meals with baked protein and non-starchy vegetables; Additionally, more water intake Recommended regular physical activity: at least 150 minutes of moderate-intensity exercise per week  Plan: Will apply for PA for PCSK9i; will inform patient upon approval  Lipid lab due in 3 months after starting PCSK9i

## 2024-06-29 NOTE — Patient Instructions (Addendum)
 Your Results:             Your most recent labs Goal  Total Cholesterol 191 < 200  Triglycerides 139 < 150  HDL (happy/good cholesterol) 59 > 40  LDL (lousy/bad cholesterol 108 < 100   Medication changes: We will start the process to get Repatha/Praluent covered by your insurance.  Once the prior authorization is complete, we will call you to let you know and confirm pharmacy information.   Lab orders:We want to repeat labs 3 months after starting PCSK9i.  We will send you a lab order to remind you once we get        closer to that time.    Trajon Rosete E. Darline Faith, Pharm.D, CPP [place] [name]. [facility] [address], [place], [place] [address] Phone: [phone]; Fax: [phone]     Praluent is a cholesterol medication that improved your body's ability to get rid of bad cholesterol known as LDL. It can lower your LDL up to 60%. It is an injection that is given under the skin every 2 weeks. The most common side effects of Praluent include runny nose, symptoms of the common cold, rarely flu or flu-like symptoms, back/muscle pain in about 3-4% of the patients, and redness, pain, or bruising at the injection site.    Repatha is a cholesterol medication that improved your body's ability to get rid of bad cholesterol known as LDL. It can lower your LDL up to 60%! It is an injection that is given under the skin every 2 weeks. The medication often requires a prior authorization from your insurance company. We will take care of submitting all the necessary information to your insurance company to get it approved. The most common side effects of Repatha include runny nose, symptoms of the common cold, rarely flu or flu-like symptoms, back/muscle pain in about 3-4% of the patients, and redness, pain, or bruising at the injection site.   It is also recommended that patients with high cholesterol adhere to a heart healthy diet, get regular exercise, avoid use of  tobacco products, and maintain a healthy weight. Steps that you can take to help in these areas:  Limit consumption of trans fats, saturated fats, and cholesterol in your diet  Increase intake of lean meats such as chicken, turkey, and fish  Increase intake of foods rich in fiber such as fresh fruits, vegetables, beans and oatmeal Exercise as you are able; even 30 minutes of walking daily can aid in increasing heart health

## 2024-06-29 NOTE — Telephone Encounter (Signed)
 Pharmacy Patient Advocate Encounter   Received notification from Physician's Office that prior authorization for Repatha is required/requested.   Insurance verification completed.   The patient is insured through [employer].   Per test claim: PA required; PA submitted to above mentioned insurance via Latent Key/confirmation #/EOC [id number] Status is pending

## 2024-07-01 ENCOUNTER — Other Ambulatory Visit (HOSPITAL_COMMUNITY): Payer: Self-pay

## 2024-07-01 NOTE — Telephone Encounter (Signed)
 Pharmacy Patient Advocate Encounter  Received notification from [facility] that Prior Authorization for Repatha has been APPROVED from 06/29/24 to 06/30/27. Ran test claim, Copay is $24.99- one month. This test claim was processed through [facility]- copay amounts may vary at other pharmacies due to pharmacy/plan contracts, or as the patient moves through the different stages of their insurance plan.   PA #/Case ID/Reference #: [id number]

## 2024-07-05 ENCOUNTER — Other Ambulatory Visit (HOSPITAL_COMMUNITY): Payer: Self-pay

## 2024-07-05 MED ORDER — REPATHA SURECLICK 140 MG/ML ~~LOC~~ SOAJ
140.0000 mg | SUBCUTANEOUS | 1 refills | Status: AC
  Filled 2024-07-05: qty 6, 84d supply, fill #0

## 2024-07-05 NOTE — Addendum Note (Signed)
 Addended by: Rivaldo Hineman E on: 07/05/2024 09:13 AM   Modules accepted: Orders

## 2024-07-05 NOTE — Telephone Encounter (Signed)
 Discussed Repatha copay and confirmed pharmacy. Rx sent. Repatha is to replace statin, communicated all information to patient.

## 2024-07-05 NOTE — Telephone Encounter (Signed)
 Please see other encounter.

## 2025-01-30 ENCOUNTER — Encounter: Admitting: Physician Assistant
